# Patient Record
Sex: Male | Born: 1950 | Race: Black or African American | Hispanic: No | Marital: Married | Smoking: Never smoker
Health system: Southern US, Community
[De-identification: ages and names within clinical notes are randomized; demographics above are authoritative.]

## PROBLEM LIST (undated history)

## (undated) DIAGNOSIS — I509 Heart failure, unspecified: Secondary | ICD-10-CM

## (undated) DIAGNOSIS — C801 Malignant (primary) neoplasm, unspecified: Secondary | ICD-10-CM

## (undated) DIAGNOSIS — I10 Essential (primary) hypertension: Secondary | ICD-10-CM

## (undated) DIAGNOSIS — E785 Hyperlipidemia, unspecified: Secondary | ICD-10-CM

## (undated) DIAGNOSIS — K219 Gastro-esophageal reflux disease without esophagitis: Secondary | ICD-10-CM

## (undated) HISTORY — PX: EYE SURGERY: SHX253

## (undated) HISTORY — DX: Hyperlipidemia, unspecified: E78.5

## (undated) HISTORY — DX: Heart failure, unspecified: I50.9

---

## 1998-12-26 DIAGNOSIS — I509 Heart failure, unspecified: Secondary | ICD-10-CM

## 1998-12-26 HISTORY — DX: Heart failure, unspecified: I50.9

## 2002-09-19 ENCOUNTER — Encounter: Payer: Self-pay | Admitting: Emergency Medicine

## 2002-09-19 ENCOUNTER — Observation Stay (HOSPITAL_COMMUNITY): Admission: EM | Admit: 2002-09-19 | Discharge: 2002-09-21 | Payer: Self-pay | Admitting: Emergency Medicine

## 2002-09-20 ENCOUNTER — Encounter: Payer: Self-pay | Admitting: Cardiovascular Disease

## 2002-09-21 ENCOUNTER — Encounter: Payer: Self-pay | Admitting: Cardiovascular Disease

## 2003-01-01 ENCOUNTER — Encounter: Admission: RE | Admit: 2003-01-01 | Discharge: 2003-04-01 | Payer: Self-pay | Admitting: Internal Medicine

## 2003-06-25 ENCOUNTER — Emergency Department (HOSPITAL_COMMUNITY): Admission: EM | Admit: 2003-06-25 | Discharge: 2003-06-25 | Payer: Self-pay | Admitting: Emergency Medicine

## 2004-10-04 ENCOUNTER — Emergency Department (HOSPITAL_COMMUNITY): Admission: EM | Admit: 2004-10-04 | Discharge: 2004-10-05 | Payer: Self-pay | Admitting: Emergency Medicine

## 2004-10-27 ENCOUNTER — Ambulatory Visit: Payer: Self-pay | Admitting: Gastroenterology

## 2004-11-01 ENCOUNTER — Ambulatory Visit: Payer: Self-pay | Admitting: Gastroenterology

## 2006-04-21 ENCOUNTER — Ambulatory Visit: Payer: Self-pay | Admitting: Internal Medicine

## 2006-05-08 ENCOUNTER — Ambulatory Visit: Payer: Self-pay | Admitting: Internal Medicine

## 2006-05-15 ENCOUNTER — Ambulatory Visit: Payer: Self-pay | Admitting: Internal Medicine

## 2006-10-12 ENCOUNTER — Ambulatory Visit: Payer: Self-pay | Admitting: Internal Medicine

## 2007-11-16 DIAGNOSIS — E785 Hyperlipidemia, unspecified: Secondary | ICD-10-CM

## 2007-11-30 ENCOUNTER — Ambulatory Visit: Payer: Self-pay | Admitting: Internal Medicine

## 2007-11-30 LAB — CONVERTED CEMR LAB
AST: 26 units/L (ref 0–37)
Albumin: 4.1 g/dL (ref 3.5–5.2)
Basophils Absolute: 0 10*3/uL (ref 0.0–0.1)
Bilirubin, Direct: 0.1 mg/dL (ref 0.0–0.3)
Chloride: 106 meq/L (ref 96–112)
Creatinine, Ser: 1.3 mg/dL (ref 0.4–1.5)
Creatinine,U: 194.5 mg/dL
Eosinophils Absolute: 0.4 10*3/uL (ref 0.0–0.6)
Eosinophils Relative: 5.4 % — ABNORMAL HIGH (ref 0.0–5.0)
GFR calc non Af Amer: 61 mL/min
Glucose, Bld: 98 mg/dL (ref 70–99)
HCT: 40.6 % (ref 39.0–52.0)
Hemoglobin: 14 g/dL (ref 13.0–17.0)
Lymphocytes Relative: 40.9 % (ref 12.0–46.0)
MCHC: 34.4 g/dL (ref 30.0–36.0)
MCV: 91.9 fL (ref 78.0–100.0)
Microalb, Ur: 1.1 mg/dL (ref 0.0–1.9)
Monocytes Absolute: 0.8 10*3/uL — ABNORMAL HIGH (ref 0.2–0.7)
Neutrophils Relative %: 43.4 % (ref 43.0–77.0)
Potassium: 5.6 meq/L — ABNORMAL HIGH (ref 3.5–5.1)
RBC: 4.42 M/uL (ref 4.22–5.81)
Sodium: 142 meq/L (ref 135–145)
Total Bilirubin: 0.5 mg/dL (ref 0.3–1.2)
WBC: 8.2 10*3/uL (ref 4.5–10.5)

## 2007-12-07 ENCOUNTER — Ambulatory Visit: Payer: Self-pay | Admitting: Internal Medicine

## 2007-12-07 LAB — CONVERTED CEMR LAB
Cholesterol, target level: 200 mg/dL
LDL Goal: 100 mg/dL

## 2008-01-24 ENCOUNTER — Telehealth: Payer: Self-pay | Admitting: Internal Medicine

## 2008-01-25 ENCOUNTER — Ambulatory Visit: Payer: Self-pay | Admitting: Internal Medicine

## 2008-01-25 DIAGNOSIS — F528 Other sexual dysfunction not due to a substance or known physiological condition: Secondary | ICD-10-CM

## 2008-01-25 DIAGNOSIS — H6120 Impacted cerumen, unspecified ear: Secondary | ICD-10-CM

## 2008-03-12 ENCOUNTER — Ambulatory Visit: Payer: Self-pay | Admitting: Internal Medicine

## 2008-03-12 LAB — CONVERTED CEMR LAB
AST: 26 units/L (ref 0–37)
Bilirubin, Direct: 0.1 mg/dL (ref 0.0–0.3)
Cholesterol: 281 mg/dL (ref 0–200)
Direct LDL: 200.7 mg/dL
HDL: 49.7 mg/dL (ref >39.0)
Total Bilirubin: 0.6 mg/dL (ref 0.3–1.2)
Total Protein: 7.2 g/dL (ref 6.0–8.3)
Triglycerides: 134 mg/dL (ref 0–149)

## 2008-03-26 ENCOUNTER — Ambulatory Visit: Payer: Self-pay | Admitting: Internal Medicine

## 2008-04-15 ENCOUNTER — Telehealth: Payer: Self-pay | Admitting: Internal Medicine

## 2008-11-19 ENCOUNTER — Ambulatory Visit: Payer: Self-pay | Admitting: Internal Medicine

## 2008-11-19 DIAGNOSIS — I10 Essential (primary) hypertension: Secondary | ICD-10-CM | POA: Insufficient documentation

## 2008-11-19 LAB — CONVERTED CEMR LAB: Cholesterol: 281 mg/dL (ref 0–200)

## 2009-04-07 ENCOUNTER — Ambulatory Visit: Payer: Self-pay | Admitting: Internal Medicine

## 2009-04-08 LAB — CONVERTED CEMR LAB
CO2: 29 meq/L (ref 19–32)
Calcium: 9.6 mg/dL (ref 8.4–10.5)
Chloride: 108 meq/L (ref 96–112)
Potassium: 3.8 meq/L (ref 3.5–5.1)
Sodium: 143 meq/L (ref 135–145)
Testosterone: 292.89 ng/dL — ABNORMAL LOW (ref 350.00–890.00)

## 2009-04-13 ENCOUNTER — Ambulatory Visit: Payer: Self-pay | Admitting: Internal Medicine

## 2009-06-25 ENCOUNTER — Telehealth: Payer: Self-pay | Admitting: Internal Medicine

## 2009-07-08 ENCOUNTER — Telehealth: Payer: Self-pay | Admitting: Internal Medicine

## 2009-07-08 ENCOUNTER — Ambulatory Visit: Payer: Self-pay | Admitting: Internal Medicine

## 2009-07-14 ENCOUNTER — Ambulatory Visit: Payer: Self-pay | Admitting: Internal Medicine

## 2009-07-17 ENCOUNTER — Ambulatory Visit: Payer: Self-pay | Admitting: Internal Medicine

## 2009-07-17 DIAGNOSIS — R42 Dizziness and giddiness: Secondary | ICD-10-CM

## 2009-07-17 LAB — CONVERTED CEMR LAB: BUN: 13 mg/dL (ref 6–23)

## 2009-07-21 ENCOUNTER — Ambulatory Visit: Payer: Self-pay | Admitting: Internal Medicine

## 2009-10-08 ENCOUNTER — Encounter (INDEPENDENT_AMBULATORY_CARE_PROVIDER_SITE_OTHER): Payer: Self-pay | Admitting: *Deleted

## 2009-10-26 ENCOUNTER — Ambulatory Visit: Payer: Self-pay | Admitting: Internal Medicine

## 2010-01-22 ENCOUNTER — Ambulatory Visit: Payer: Self-pay | Admitting: Internal Medicine

## 2010-01-22 DIAGNOSIS — N410 Acute prostatitis: Secondary | ICD-10-CM | POA: Insufficient documentation

## 2010-04-12 ENCOUNTER — Telehealth: Payer: Self-pay | Admitting: Internal Medicine

## 2010-06-14 ENCOUNTER — Telehealth: Payer: Self-pay | Admitting: Internal Medicine

## 2010-06-14 ENCOUNTER — Ambulatory Visit: Payer: Self-pay | Admitting: Internal Medicine

## 2010-06-14 DIAGNOSIS — IMO0002 Reserved for concepts with insufficient information to code with codable children: Secondary | ICD-10-CM

## 2010-09-09 ENCOUNTER — Ambulatory Visit: Payer: Self-pay | Admitting: Internal Medicine

## 2010-09-09 DIAGNOSIS — J069 Acute upper respiratory infection, unspecified: Secondary | ICD-10-CM | POA: Insufficient documentation

## 2010-09-09 DIAGNOSIS — J029 Acute pharyngitis, unspecified: Secondary | ICD-10-CM | POA: Insufficient documentation

## 2010-09-09 LAB — CONVERTED CEMR LAB: Rapid Strep: NEGATIVE

## 2010-12-02 ENCOUNTER — Ambulatory Visit: Payer: Self-pay

## 2010-12-02 ENCOUNTER — Ambulatory Visit: Payer: Self-pay | Admitting: Internal Medicine

## 2011-01-25 NOTE — Progress Notes (Signed)
Summary: viagra rx  Phone Note Call from Patient Call back at Home Phone [phone]   Caller: Patient Call For: Stacie Glaze MD Summary of Call: Brian Kaufman pt needs viagra Initial call taken by: Heron Sabins,  April 12, 2010 10:30 AM    Prescriptions: VIAGRA 100 MG TABS (SILDENAFIL CITRATE) Use as directed  #6 x 6   Entered by:   Willy Eddy, LPN   Authorized by:   Stacie Glaze MD   Signed by:   Willy Eddy, LPN on 64/33/2951   Method used:   Telephoned to ...       [facility] [address] (retail)       [address] [place], [place]  [address]       Ph: [phone] or [phone]       Fax: [phone]   RxID:   [id number]

## 2011-01-25 NOTE — Assessment & Plan Note (Signed)
Summary: possible prostatitis'/bmw   Vital Signs:  Patient profile:   60 year old male Height:      66 inches Weight:      188 pounds BMI:     30.45 Temp:     98.4 degrees F oral Pulse rate:   80 / minute Resp:     14 per minute BP sitting:   136 / 80  (left arm)  Vitals Entered By: Willy Eddy, LPN (January 22, 2010 4:50 PM) CC: c/o groin pain radiating around to rt flank, Dysuria   CC:  c/o groin pain radiating around to rt flank and Dysuria.  History of Present Illness: acute visit for pain in testicles nad grion associated with moderate dysuria and bacl pain of several days duration  Dysuria      This is a 60 year old man who presents with Dysuria.  The patient reports burning with urination, urinary frequency, and urgency.  Associated symptoms include back pain.  The patient denies the following associated symptoms: shaking chills and flank pain.  The patient denies the following risk factors: diabetes, prior antibiotics, immunosuppression, history of GU anomaly, history of pyelonephritis, pregnancy, history of STD, and analgesic abuse.    Preventive Screening-Counseling & Management  Alcohol-Tobacco     Smoking Status: never     Passive Smoke Exposure: no  Problems Prior to Update: 1)  Acute Prostatitis  (ICD-601.0) 2)  Vertigo  (ICD-780.4) 3)  Hypertension, Labile  (ICD-401.9) 4)  Erectile Dysfunction, Mild  (ICD-302.72) 5)  Cerumen Impaction, Bilateral  (ICD-380.4) 6)  Preventive Health Care  (ICD-V70.0) 7)  Family History Diabetes 1st Degree Relative  (ICD-V18.0) 8)  Hyperlipidemia  (ICD-272.4)  Current Problems (verified): 1)  Vertigo  (ICD-780.4) 2)  Hypertension, Labile  (ICD-401.9) 3)  Erectile Dysfunction, Mild  (ICD-302.72) 4)  Cerumen Impaction, Bilateral  (ICD-380.4) 5)  Preventive Health Care  (ICD-V70.0) 6)  Family History Diabetes 1st Degree Relative  (ICD-V18.0) 7)  Hyperlipidemia  (ICD-272.4)  Medications Prior to Update: 1)  Benicar 20  Mg Tabs (Olmesartan Medoxomil) .... One By Mouth Daily 2)  Crestor 10 Mg Tabs (Rosuvastatin Calcium) .... One By Mouth Daily 3)  Viagra 100 Mg Tabs (Sildenafil Citrate) .... Use As Directed 4)  Antivert 25 Mg Tabs (Meclizine Hcl) .... One By Mouth Tid 5)  Androgel Pump 1 % Gel (Testosterone) .... 4 Pumps To Skin Daily  Current Medications (verified): 1)  Benicar 20 Mg Tabs (Olmesartan Medoxomil) .... One By Mouth Daily 2)  Crestor 10 Mg Tabs (Rosuvastatin Calcium) .... One By Mouth Daily 3)  Viagra 100 Mg Tabs (Sildenafil Citrate) .... Use As Directed 4)  Androgel Pump 1 % Gel (Testosterone) .... 4 Pumps To Skin Daily 5)  Ciprofloxacin Hcl 500 Mg Tabs (Ciprofloxacin Hcl) .... One By Mouth Two Times A Day  Allergies (verified): No Known Drug Allergies  Past History:  Family History: Last updated: 2007-12-13 father died from brain tumor mother died fro DM HTN Family History Diabetes 1st degree relative  Social History: Last updated: 2007-12-13 Retired Married Never Smoked Alcohol use-no Drug use-no Regular exercise-yes  Risk Factors: Exercise: yes (2007/12/13)  Risk Factors: Smoking Status: never (01/22/2010) Passive Smoke Exposure: no (01/22/2010)  Past medical, surgical, family and social histories (including risk factors) reviewed, and no changes noted (except as noted below).  Past Medical History: Reviewed history from 11/16/2007 and no changes required. Hyperlipidemia Congestive heart failure Diabetes mellitus, type II  Family History: Reviewed history from 12/13/2007 and no changes  required. father died from brain tumor mother died fro DM HTN Family History Diabetes 1st degree relative  Social History: Reviewed history from 12/07/2007 and no changes required. Retired Married Never Smoked Alcohol use-no Drug use-no Regular exercise-yes  Review of Systems  The patient denies anorexia, fever, weight loss, weight gain, vision loss, decreased hearing,  hoarseness, chest pain, syncope, dyspnea on exertion, peripheral edema, prolonged cough, headaches, hemoptysis, abdominal pain, hematochezia, hematuria, incontinence, genital sores, muscle weakness, suspicious skin lesions, transient blindness, difficulty walking, depression, unusual weight change, abnormal bleeding, enlarged lymph nodes, angioedema, breast masses, and testicular masses.    Physical Exam  General:  Well-developed,well-nourished,in no acute distress; alert,appropriate and cooperative throughout examination Head:  atraumatic and diffuse alopecia.   Eyes:  pupils equal and pupils round.   Ears:  R ear normal and L ear normal.   Neck:  No deformities, masses, or tenderness noted. Lungs:  normal respiratory effort and R base dullness.   Heart:  normal rate and regular rhythm.   Rectal:  no external abnormalities, no hemorrhoids, and normal sphincter tone.   Prostate:  tender, boggy, and 2+ enlarged.   Msk:  no joint tenderness and no joint swelling.   Pulses:  R and L carotid,radial,femoral,dorsalis pedis and posterior tibial pulses are full and equal bilaterally Extremities:  No clubbing, cyanosis, edema, or deformity noted with normal full range of motion of all joints.     Impression & Recommendations:  Problem # 1:  ACUTE PROSTATITIS (ICD-601.0) Assessment New acute protatitis no evidence of testiculat masses prostate is boggy and tender and has a hx of prior prostate infectiions ( many years ago) antibiotic sent to pharmacy and a carefull discussio of symptoms and when to return should they not improve  Problem # 2:  HYPERTENSION, LABILE (ICD-401.9) Assessment: Improved  His updated medication list for this problem includes:    Benicar 20 Mg Tabs (Olmesartan medoxomil) ..... One by mouth daily  BP today: 136/80 Prior BP: 142/80 (10/26/2009)  Prior 10 Yr Risk Heart Disease: Not enough information (12/07/2007)  Labs Reviewed: K+: 3.8 (04/07/2009) Creat: : 1.3  (07/17/2009)   Chol: 281 (11/19/2008)   HDL: 64.6 (11/19/2008)   LDL: DEL (11/19/2008)   TG: 97 (11/19/2008)  Complete Medication List: 1)  Benicar 20 Mg Tabs (Olmesartan medoxomil) .... One by mouth daily 2)  Crestor 10 Mg Tabs (Rosuvastatin calcium) .... One by mouth daily 3)  Viagra 100 Mg Tabs (Sildenafil citrate) .... Use as directed 4)  Androgel Pump 1 % Gel (Testosterone) .... 4 pumps to skin daily 5)  Ciprofloxacin Hcl 500 Mg Tabs (Ciprofloxacin hcl) .... One by mouth two times a day  Patient Instructions: 1)  Take your antibiotic as prescribed until ALL of it is gone, but stop if you develop a rash or swelling and contact our office as soon as possible. Prescriptions: CIPROFLOXACIN HCL 500 MG TABS (CIPROFLOXACIN HCL) one by mouth two times a day  #42 x 0   Entered and Authorized by:   Stacie Glaze MD   Signed by:   Stacie Glaze MD on 01/22/2010   Method used:   Electronically to        [facility] [address] (retail)       [address] [place], [place]  [address]       Ph: [phone] or [phone]       Fax: [phone]   RxID:   [id number] CIPROFLOXACIN HCL  500 MG TABS (CIPROFLOXACIN HCL) one by mouth two times a day  #42 x 0   Entered and Authorized by:   Stacie Glaze MD   Signed by:   Stacie Glaze MD on 01/22/2010   Method used:   [facility]ically to        [facility]  [id number] (retail)       [address]       [place], [place]  [address]       Ph: [phone] or [phone]       Fax: [phone]   RxID:   [id number]

## 2011-01-25 NOTE — Progress Notes (Signed)
Summary: multiple problems  Phone Note Call from Patient   Caller: Patient Call For: Stacie Glaze MD Reason for Call: Acute Illness Summary of Call: Pt is complaining of low back pain, and post coital pain about 45 min later.  BP was 170/120 yesterday, but went down after taking BP.  States he takes his meds "most of the time?Marland Kitchen Also complains of vertigo? [phone] Initial call taken by: Lynann Beaver CMA,  June 14, 2010 8:44 AM  Follow-up for Phone Call        ov given for this afternnon Follow-up by: Willy Eddy, LPN,  June 14, 2010 9:25 AM

## 2011-01-25 NOTE — Assessment & Plan Note (Signed)
Summary: flu like symptoms//slm   Vital Signs:  Patient profile:   60 year old male Height:      66 inches Weight:      190 pounds O2 Sat:      99 % on Room air Temp:     97.7 degrees F oral Pulse rate:   73 / minute BP sitting:   138 / 78  (left arm)  Vitals Entered By: Jeremy Johann CMA (December 02, 2010 11:27 AM)  O2 Flow:  Room air CC: headache, nasal congestion,  dry cough x2weeks, issue with prostate   CC:  headache, nasal congestion, dry cough x2weeks, and issue with prostate.  History of Present Illness: Patient presents to clinic as a workin for evaluation of URI symptoms. Notes 7+ day h/o productive cough, nasal congestion and drainagae. Denies f/c, dyspnea, chest pain or wheezing. H/o possible prostatitis s/p course of cipro with resolution of symptoms. Believes has minimal return of symptoms with mild back pain similar to previous. No current urinary symptoms.  Current Medications (verified): 1)  Benicar 20 Mg Tabs (Olmesartan Medoxomil) .... One By Mouth Daily 2)  Crestor 10 Mg Tabs (Rosuvastatin Calcium) .... One By Mouth Daily 3)  Viagra 100 Mg Tabs (Sildenafil Citrate) .... Use As Directed 4)  Meclizine Hcl 25 Mg Tabs (Meclizine Hcl) .... One By Mouth Three Times A Day  Allergies (verified): No Known Drug Allergies  Review of Systems      See HPI  Physical Exam  General:  Well-developed,well-nourished,in no acute distress; alert,appropriate and cooperative throughout examination Head:  Normocephalic and atraumatic without obvious abnormalities. No apparent alopecia or balding. Ears:  External ear exam shows no significant lesions or deformities.  Otoscopic examination reveals clear canals, tympanic membranes are intact bilaterally without bulging, retraction, inflammation or discharge. Hearing is grossly normal bilaterally. Nose:  no external deformity, no airflow obstruction, no intranasal foreign body, no mucosal friability, mucosal erythema, and mucosal  edema.   Mouth:  pharynx pink and moist, no erythema, and no exudates.   Neck:  No deformities, masses, or tenderness noted. Lungs:  Normal respiratory effort, chest expands symmetrically. Lungs are clear to auscultation, no crackles or wheezes. Heart:  Normal rate and regular rhythm. S1 and S2 normal without gallop, murmur, click, rub or other extra sounds. Skin:  turgor normal and no rashes.     Impression & Recommendations:  Problem # 1:  URI (ICD-465.9) Begin levaquin as prescribed. Continue otc symptomatic tx. Increase by mouth fluids. Followup closely if no improvement or worsening.  Problem # 2:  ACUTE PROSTATITIS (ICD-601.0) Possible return of minimal symptoms. Abx as above and followup if no resolution.  Complete Medication List: 1)  Benicar 20 Mg Tabs (Olmesartan medoxomil) .... One by mouth daily 2)  Crestor 10 Mg Tabs (Rosuvastatin calcium) .... One by mouth daily 3)  Viagra 100 Mg Tabs (Sildenafil citrate) .... Use as directed 4)  Meclizine Hcl 25 Mg Tabs (Meclizine hcl) .... One by mouth three times a day 5)  Levaquin 500 Mg Tabs (Levofloxacin) .... One by mouth once daily  Patient Instructions: 1)  Get plenty of rest, drink lots of clear liquids, and use Tylenol or Ibuprofen for fever and comfort. Return in 7-10 days if you're not better:sooner if you're feeling worse. 2)  Take your antibiotic as prescribed until ALL of it is gone, but stop if you develop a rash or swelling and contact our office as soon as possible. Prescriptions: LEVAQUIN 500 MG TABS (LEVOFLOXACIN)  one by mouth once daily  #7 x 0   Entered and Authorized by:   Edwyna Perfect MD   Signed by:   Edwyna Perfect MD on 12/02/2010   Method used:   Electronically to        [facility] [phone]* (retail)       [address] [place], [place]  [address]       Ph: [phone] or [phone]       Fax: [phone]   RxID:   [id number]    Orders Added: 1)  Est. Patient Level IV [34742]

## 2011-01-25 NOTE — Assessment & Plan Note (Signed)
Summary: elevated bp/bmw   Vital Signs:  Patient profile:   60 year old male Height:      66 inches Weight:      188 pounds BMI:     30.45 Temp:     98.4 degrees F oral Pulse rate:   76 / minute Resp:     14 per minute BP sitting:   146 / 80  (left arm)  Vitals Entered By: Willy Eddy, LPN (June 14, 2010 4:20 PM) CC: c/o groin and low back pain and elevated bp-m has not been taking benicar as ordered   CC:  c/o groin and low back pain and elevated bp-m has not been taking benicar as ordered.  Preventive Screening-Counseling & Management  Alcohol-Tobacco     Smoking Status: never     Passive Smoke Exposure: no  Current Problems (verified): 1)  Acute Prostatitis  (ICD-601.0) 2)  Vertigo  (ICD-780.4) 3)  Hypertension, Labile  (ICD-401.9) 4)  Erectile Dysfunction, Mild  (ICD-302.72) 5)  Cerumen Impaction, Bilateral  (ICD-380.4) 6)  Preventive Health Care  (ICD-V70.0) 7)  Family History Diabetes 1st Degree Relative  (ICD-V18.0) 8)  Hyperlipidemia  (ICD-272.4)  Current Medications (verified): 1)  Benicar 20 Mg Tabs (Olmesartan Medoxomil) .... One By Mouth Daily 2)  Crestor 10 Mg Tabs (Rosuvastatin Calcium) .... One By Mouth Daily 3)  Viagra 100 Mg Tabs (Sildenafil Citrate) .... Use As Directed 4)  Androgel Pump 1 % Gel (Testosterone) .... 4 Pumps To Skin Daily  Allergies (verified): No Known Drug Allergies  Past History:  Family History: Last updated: December 23, 2007 father died from brain tumor mother died fro DM HTN Family History Diabetes 1st degree relative  Social History: Last updated: 12/23/07 Retired Married Never Smoked Alcohol use-no Drug use-no Regular exercise-yes  Risk Factors: Exercise: yes (12/23/2007)  Risk Factors: Smoking Status: never (06/14/2010) Passive Smoke Exposure: no (06/14/2010)  Past medical, surgical, family and social histories (including risk factors) reviewed, and no changes noted (except as noted below).  Past  Medical History: Reviewed history from 11/16/2007 and no changes required. Hyperlipidemia Congestive heart failure Diabetes mellitus, type II  Family History: Reviewed history from 2007-12-23 and no changes required. father died from brain tumor mother died fro DM HTN Family History Diabetes 1st degree relative  Social History: Reviewed history from 23-Dec-2007 and no changes required. Retired Married Never Smoked Alcohol use-no Drug use-no Regular exercise-yes  Review of Systems  The patient denies anorexia, fever, weight loss, weight gain, vision loss, decreased hearing, hoarseness, chest pain, syncope, dyspnea on exertion, peripheral edema, prolonged cough, headaches, hemoptysis, abdominal pain, melena, hematochezia, severe indigestion/heartburn, hematuria, incontinence, genital sores, muscle weakness, suspicious skin lesions, transient blindness, difficulty walking, depression, unusual weight change, abnormal bleeding, enlarged lymph nodes, angioedema, and breast masses.    Physical Exam  General:  Well-developed,well-nourished,in no acute distress; alert,appropriate and cooperative throughout examination Head:  atraumatic and diffuse alopecia.   Eyes:  pupils equal and pupils round.   Ears:  R ear normal and L ear normal.   Nose:  no external deformity and no nasal discharge.   Mouth:  Oral mucosa and oropharynx without lesions or exudates.  Teeth in good repair. Neck:  No deformities, masses, or tenderness noted. Lungs:  normal respiratory effort and R base dullness.   Heart:  normal rate and regular rhythm.   Abdomen:  Bowel sounds positive,abdomen soft and non-tender without masses, organomegaly or hernias noted. Msk:  no joint tenderness and no joint swelling.   Extremities:  No clubbing, cyanosis, edema, or deformity noted with normal full range of motion of all joints.   Neurologic:  alert & oriented X3 and cranial nerves II-XII intact.     Impression &  Recommendations:  Problem # 1:  HYPERTENSION, LABILE (ICD-401.9)  His updated medication list for this problem includes:    Benicar 20 Mg Tabs (Olmesartan medoxomil) ..... One by mouth daily  BP today: 146/80 Prior BP: 136/80 (01/22/2010)  Prior 10 Yr Risk Heart Disease: Not enough information (12/07/2007)  Labs Reviewed: K+: 3.8 (04/07/2009) Creat: : 1.3 (07/17/2009)   Chol: 281 (11/19/2008)   HDL: 64.6 (11/19/2008)   LDL: DEL (11/19/2008)   TG: 97 (11/19/2008)  Problem # 2:  ACUTE PROSTATITIS (ICD-601.0) recurrent cipro for 3 weeks urge to take all or recurrence is likely ( he has a habit of stopping as soon as the symptoms stop  Complete Medication List: 1)  Benicar 20 Mg Tabs (Olmesartan medoxomil) .... One by mouth daily 2)  Crestor 10 Mg Tabs (Rosuvastatin calcium) .... One by mouth daily 3)  Viagra 100 Mg Tabs (Sildenafil citrate) .... Use as directed 4)  Androgel Pump 1 % Gel (Testosterone) .... 4 pumps to skin daily 5)  Ciprofloxacin Hcl 500 Mg Tabs (Ciprofloxacin hcl) .... One by mouth two times a day for 21 days 6)  Meclizine Hcl 25 Mg Tabs (Meclizine hcl) .... One by mouth three times a day  Patient Instructions: 1)  Please schedule a follow-up appointment in 3 months. Prescriptions: MECLIZINE HCL 25 MG TABS (MECLIZINE HCL) one by mouth three times a day  #30 x 11   Entered and Authorized by:   Stacie Glaze MD   Signed by:   Stacie Glaze MD on 06/14/2010   Method used:   Electronically to        [facility] [phone]* (retail)       [address] [place], [place]  [address]       Ph: [phone] or [phone]       Fax: [phone]   RxID:   [id number] CIPROFLOXACIN HCL 500 MG TABS (CIPROFLOXACIN HCL) one by mouth two times a day for 21 days  #42 x 0   Entered and Authorized by:   Stacie Glaze MD   Signed by:   Stacie Glaze MD on 06/14/2010   Method used:   Electronically to        [facility] [phone]* (retail)       [address] [place], [place]  [address]       Ph: [phone] or [phone]       Fax: [phone]   RxID:   [id number] CIPRO 500 MG TABS (CIPROFLOXACIN HCL) one by mouth two times a day  #42 x 0   Entered and Authorized by:   Stacie Glaze MD   Signed by:   Stacie Glaze MD on 06/14/2010   Method used:   Electronically to        [facility]  [id number] (retail)       [address]       [place], [place]  [address]       Ph: [phone] or [phone]       Fax: [phone]   RxID:   [id number]

## 2011-01-25 NOTE — Assessment & Plan Note (Signed)
Summary: st/njr   Vital Signs:  Patient profile:   60 year old male Weight:      183 pounds Temp:     99.2 degrees F oral BP sitting:   140 / 80  (right arm) Cuff size:   regular  Vitals Entered By: Duard Brady LPN (September 09, 2010 2:15 PM) CC: c/o fatigue , tight cough , exposed to strep Is Patient Diabetic? No   CC:  c/o fatigue , tight cough , and exposed to strep.  History of Present Illness: 60 year old patient with a two to 3 day history of sore throat and chest congestion and cough.  There is been some concern about a strep exposure is noncompliant.  His cough and fatigue.  Does have low-grade fever.  No sputum production, shortness of breath or chest pain  Allergies (verified): No Known Drug Allergies  Past History:  Past Medical History: Reviewed history from 11/16/2007 and no changes required. Hyperlipidemia Congestive heart failure Diabetes mellitus, type II  Review of Systems       The patient complains of anorexia, fever, and prolonged cough.  The patient denies weight loss, weight gain, vision loss, decreased hearing, hoarseness, chest pain, syncope, dyspnea on exertion, peripheral edema, headaches, hemoptysis, abdominal pain, melena, hematochezia, severe indigestion/heartburn, hematuria, incontinence, genital sores, muscle weakness, suspicious skin lesions, transient blindness, difficulty walking, depression, unusual weight change, abnormal bleeding, enlarged lymph nodes, angioedema, breast masses, and testicular masses.    Physical Exam  General:  Well-developed,well-nourished,in no acute distress; alert,appropriate and cooperative throughout examination Head:  Normocephalic and atraumatic without obvious abnormalities. No apparent alopecia or balding. Eyes:  No corneal or conjunctival inflammation noted. EOMI. Perrla. Funduscopic exam benign, without hemorrhages, exudates or papilledema. Vision grossly normal. Ears:  External ear exam shows no  significant lesions or deformities.  Otoscopic examination reveals clear canals, tympanic membranes are intact bilaterally without bulging, retraction, inflammation or discharge. Hearing is grossly normal bilaterally. Mouth:  Oral mucosa and oropharynx without lesions or exudates.  Teeth in good repair. Neck:  No deformities, masses, or tenderness noted. Lungs:  Normal respiratory effort, chest expands symmetrically. Lungs are clear to auscultation, no crackles or wheezes. Heart:  Normal rate and regular rhythm. S1 and S2 normal without gallop, murmur, click, rub or other extra sounds.   Impression & Recommendations:  Problem # 1:  URI (ICD-465.9)  His updated medication list for this problem includes:    Hydrocodone-homatropine 5-1.5 Mg/50ml Syrp (Hydrocodone-homatropine) .Marland Kitchen... 1 teaspoon every 6 hours as needed for cough  Problem # 2:  SORE THROAT (ICD-462)  Orders: Rapid Strep ([address])  Complete Medication List: 1)  Benicar 20 Mg Tabs (Olmesartan medoxomil) .... One by mouth daily 2)  Crestor 10 Mg Tabs (Rosuvastatin calcium) .... One by mouth daily 3)  Viagra 100 Mg Tabs (Sildenafil citrate) .... Use as directed 4)  Androgel Pump 1 % Gel (Testosterone) .... 4 pumps to skin daily 5)  Meclizine Hcl 25 Mg Tabs (Meclizine hcl) .... One by mouth three times a day 6)  Hydrocodone-homatropine 5-1.5 Mg/36ml Syrp (Hydrocodone-homatropine) .Marland Kitchen.. 1 teaspoon every 6 hours as needed for cough  Patient Instructions: 1)  Limit your Sodium (Salt) to less than 2 grams a day(slightly less than 1/2 a teaspoon) to prevent fluid retention, swelling, or worsening of symptoms. 2)  Get plenty of rest, drink lots of clear liquids, and use Tylenol for fever and comfort. Return in 7-10 days if you're not better:sooner if you're feeling worse. Prescriptions: HYDROCODONE-HOMATROPINE 5-1.5 MG/5ML SYRP (HYDROCODONE-HOMATROPINE)  1 teaspoon every 6 hours as needed for cough  #6 oz x 0   Entered and Authorized by:    Gordy Savers  MD   Signed by:   Gordy Savers  MD on 09/09/2010   Method used:   Print then Give to Patient   RxID:   [id number]   Laboratory Results  Date/Time Received: September 09, 2010 2:31 PM  Date/Time Reported: September 09, 2010 2:31 PM   Other Tests  Rapid Strep: negative

## 2011-02-10 ENCOUNTER — Other Ambulatory Visit: Payer: Self-pay | Admitting: Internal Medicine

## 2011-02-11 ENCOUNTER — Other Ambulatory Visit: Payer: Self-pay | Admitting: *Deleted

## 2011-02-11 MED ORDER — OLMESARTAN MEDOXOMIL 20 MG PO TABS: 20.0000 mg | ORAL_TABLET | Freq: Every day | ORAL | Status: DC

## 2011-02-11 MED ORDER — ROSUVASTATIN CALCIUM 10 MG PO TABS: 10.0000 mg | ORAL_TABLET | Freq: Every day | ORAL | Status: DC

## 2011-05-13 NOTE — H&P (Signed)
Brian Kaufman, Brian Kaufman NO.:  [id number]   MEDICAL RECORD NO.:  [id number]                   PATIENT TYPE:  INP   LOCATION:  0374                                 FACILITY:  [facility]   PHYSICIAN:  Brian Kaufman, M.D. [facility]           DATE OF BIRTH:  01-Jan-1951   DATE OF ADMISSION:  09/19/2002  DATE OF DISCHARGE:                                HISTORY & PHYSICAL   Consult by Dr. Everardo All for chest pain, heart failure.   The patient is a very extremely pleasant 60 year old patient of Dr. Lovell Sheehan.  He has a long-standing history of GERD which he takes H2 blockers for.  He  had a severe episode of substernal chest pain after eating pizza.  This was  like heartburn, but much worse and did not go away.  He has no previous  history of coronary artery disease.   The patient had some associated diaphoresis.  Interestingly, he had no  shortness of breath but his chest x-ray showed a question of mild congestive  heart failure.  He also had mild cardiomegaly on his chest x-ray.   His pain was relieved in the ER with morphine and he has not had a  recurrence.  Again, he has never had any significant cardiac problems.  There is a question of borderline hypertension and positive family history  of coronary disease.  He also has a significant history of sarcoid in the  family.   Since his hospitalization he has had a normal 2-D echocardiogram with no  wall motion abnormalities.  Ruled out for myocardial infarction and his EKG  is normal.   PHYSICAL EXAMINATION:  VITAL SIGNS:  Blood pressure 130/60, pulse 68 and  regular.  LUNGS:  Clear.  NECK:  Carotids normal.  CARDIAC:  There is an S1, S2 without murmur, rub, gallop, click.  ABDOMEN:  Benign.  EXTREMITIES:  Lower extremities with intact pulses, no edema.   LABORATORIES:  As indicated, his electrocardiogram showed sinus rhythm with  borderline voltage criteria for LVH.  There are no acute changes.  His  enzymes  are negative.  His chest x-ray showed mild cardiomegaly with mild  pulmonary venous congestion.   He was given one dose of Lasix in the ER and his lungs are clear.   IMPRESSION:  The patient's episode of chest pain sounded like a severe  episode of reflux or gastroesophageal reflux disease after eating pizza.  However, the slight heart failure on chest x-ray and cardiomegaly bother me  a little bit.  His echocardiogram was not remarkable and indeed, did not  really show significant cardiac enlargement with normal LV function.  Since  his EKG and enzymes are normal, I think it is reasonable to proceed with a  stress Cardiolite study in the morning.  ACE inhibitor will be added for  blood pressure control.  Further recommendations will be based on the  results of  his stress test.  The plan was done over with his wife and family  and they are willing to proceed.                                              Brian Kaufman, M.D. [facility]   PCN/MEDQ  D:  09/20/2002  T:  09/20/2002  Job:  [id number]

## 2011-07-18 ENCOUNTER — Other Ambulatory Visit: Payer: Self-pay | Admitting: *Deleted

## 2011-07-18 MED ORDER — ROSUVASTATIN CALCIUM 10 MG PO TABS: 10.0000 mg | ORAL_TABLET | Freq: Every day | ORAL | Status: DC

## 2011-07-18 MED ORDER — OLMESARTAN MEDOXOMIL 20 MG PO TABS: 20.0000 mg | ORAL_TABLET | Freq: Every day | ORAL | Status: DC

## 2011-08-01 ENCOUNTER — Encounter: Payer: Self-pay | Admitting: Internal Medicine

## 2011-08-02 ENCOUNTER — Ambulatory Visit (INDEPENDENT_AMBULATORY_CARE_PROVIDER_SITE_OTHER): Payer: Self-pay | Admitting: Internal Medicine

## 2011-08-02 ENCOUNTER — Encounter: Payer: Self-pay | Admitting: Internal Medicine

## 2011-08-02 VITALS — BP 140/80 | HR 72 | Temp 98.2°F | Resp 16 | Ht 67.0 in | Wt 185.0 lb

## 2011-08-02 DIAGNOSIS — N410 Acute prostatitis: Secondary | ICD-10-CM

## 2011-08-02 MED ORDER — TRAMADOL HCL 50 MG PO TABS: 50.0000 mg | ORAL_TABLET | Freq: Four times a day (QID) | ORAL | Status: AC | PRN

## 2011-08-02 MED ORDER — LEVOFLOXACIN 500 MG PO TABS: 500.0000 mg | ORAL_TABLET | Freq: Every day | ORAL | Status: AC

## 2011-08-02 NOTE — Progress Notes (Signed)
  Subjective:    Patient ID: Brian Kaufman, male    DOB: November 09, 1951, 60 y.o.   MRN: [id number]  HPI Patient has a history of recurrent prostatitis he has noticed some testicular pain and back pain as well as noticed some blood in in his urine. No fever or chills but he has a sense of lack of well-being decreased energy   Review of Systems  Genitourinary: Positive for hematuria and testicular pain.  Musculoskeletal: Positive for back pain.   Past Medical History  Diagnosis Date  . Hyperlipidemia   . Diabetes mellitus   . CHF (congestive heart failure)    No past surgical history on file.  reports that he has never smoked. He does not have any smokeless tobacco history on file. He reports that he does not drink alcohol or use illicit drugs. family history includes Diabetes in his mother and Hypertension in his mother. Allergies not on file     Objective:   Physical Exam  Nursing note and vitals reviewed. Constitutional: He appears well-developed and well-nourished.  HENT:  Head: Normocephalic and atraumatic.  Eyes: Conjunctivae are normal. Pupils are equal, round, and reactive to light.  Neck: Normal range of motion. Neck supple.  Cardiovascular: Normal rate and regular rhythm.   Pulmonary/Chest: Effort normal and breath sounds normal.  Abdominal: Soft. Bowel sounds are normal.  Genitourinary: Penile tenderness present.       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

## 2011-09-07 ENCOUNTER — Telehealth: Payer: Self-pay | Admitting: Internal Medicine

## 2011-09-07 NOTE — Telephone Encounter (Signed)
Pt has changed to mail order pharmacy [facility] and is req refills of olmesartan (BENICAR) 20 MG tablet ,rosuvastatin (CRESTOR) 10 MG tablet,VIAGRA 100 MG tablet,levofloxacin (LEVAQUIN) 500 MG tablet. Pls call in to [facility] [phone]. Pt is out of Benicar, Crestor, and Levofloxacin, and is req a partial refill of these [facility] [place] [facility] order arrives.

## 2011-09-07 NOTE — Telephone Encounter (Signed)
Left message on machine levaquin is not on regular med regimen therefore it cant be reordered- the benicar and crestor [facility] [place]  #90w/3 refills in July and is still valid-- if he still wants he sent to [facility] , let us know

## 2011-09-28 ENCOUNTER — Other Ambulatory Visit: Payer: Self-pay

## 2011-10-05 ENCOUNTER — Encounter: Payer: Self-pay | Admitting: Internal Medicine

## 2011-10-05 ENCOUNTER — Ambulatory Visit (INDEPENDENT_AMBULATORY_CARE_PROVIDER_SITE_OTHER): Payer: Self-pay | Admitting: Internal Medicine

## 2011-10-05 VITALS — BP 140/78 | HR 72 | Temp 98.2°F | Resp 16 | Ht 67.0 in | Wt 192.0 lb

## 2011-10-05 DIAGNOSIS — Z0000 Encounter for general adult medical examination without abnormal findings: Secondary | ICD-10-CM

## 2011-10-05 DIAGNOSIS — I10 Essential (primary) hypertension: Secondary | ICD-10-CM

## 2011-10-05 NOTE — Patient Instructions (Signed)
Set up a lab visit as you Mr. fasting laboratory physical

## 2011-10-05 NOTE — Progress Notes (Signed)
  Subjective:    Patient ID: Brian Kaufman, male    DOB: 06-02-1951, 60 y.o.   MRN: [id number]  HPI [facility]    Review of Systems  Constitutional: Negative for fever and fatigue.  HENT: Negative for hearing loss, congestion, neck pain and postnasal drip.   Eyes: Negative for discharge, redness and visual disturbance.  Respiratory: Negative for cough, shortness of breath and wheezing.   Cardiovascular: Negative for leg swelling.  Gastrointestinal: Negative for abdominal pain, constipation and abdominal distention.  Genitourinary: Negative for urgency and frequency.  Musculoskeletal: Negative for joint swelling and arthralgias.  Skin: Negative for color change and rash.  Neurological: Negative for weakness and light-headedness.  Hematological: Negative for adenopathy.  Psychiatric/Behavioral: Negative for behavioral problems.   Past Medical History  Diagnosis Date  . Hyperlipidemia   . Diabetes mellitus   . CHF (congestive heart failure)    No past surgical history on file.  reports that he has never smoked. He does not have any smokeless tobacco history on file. He reports that he does not drink alcohol or use illicit drugs. family history includes Diabetes in his mother and Hypertension in his mother. No Known Allergies     Objective:   Physical Exam  Nursing note and vitals reviewed. Constitutional: He appears well-developed and well-nourished.  HENT:  Head: Normocephalic and atraumatic.  Eyes: Conjunctivae are normal. Pupils are equal, round, and reactive to light.  Neck: Normal range of motion. Neck supple.  Cardiovascular: Normal rate and regular rhythm.   Pulmonary/Chest: Effort normal and breath sounds normal.  Abdominal: Soft. Bowel sounds are normal.  Genitourinary: Rectum normal and prostate normal.  Musculoskeletal: Normal range of motion.  Skin: Skin is warm and dry.  Psychiatric: He has a normal mood and affect. His behavior is normal.            Assessment & Plan:   Informed consent was obtained and side gel was inserted into the ears bilaterally using the lavage kit the ears were lavaged until clean inspection with a cerumen spoon to make sure residual wax was not present patient tolerated the procedure well.

## 2011-10-07 ENCOUNTER — Other Ambulatory Visit (INDEPENDENT_AMBULATORY_CARE_PROVIDER_SITE_OTHER): Payer: Self-pay

## 2011-10-07 DIAGNOSIS — Z0000 Encounter for general adult medical examination without abnormal findings: Secondary | ICD-10-CM

## 2011-10-07 LAB — CBC WITH DIFFERENTIAL/PLATELET
Basophils Absolute: 0 10*3/uL (ref 0.0–0.1)
Eosinophils Absolute: 0.5 10*3/uL (ref 0.0–0.7)
HCT: 38.4 % — ABNORMAL LOW (ref 39.0–52.0)
Lymphs Abs: 2.5 10*3/uL (ref 0.7–4.0)
MCHC: 32 g/dL (ref 30.0–36.0)
MCV: 94.2 fl (ref 78.0–100.0)
Monocytes Absolute: 0.7 10*3/uL (ref 0.1–1.0)
Neutrophils Relative %: 44.3 % (ref 43.0–77.0)
Platelets: 253 10*3/uL (ref 150.0–400.0)
RDW: 15.5 % — ABNORMAL HIGH (ref 11.5–14.6)

## 2011-10-07 LAB — MICROALBUMIN / CREATININE URINE RATIO: Microalb Creat Ratio: 0.5 mg/g (ref 0.0–30.0)

## 2011-10-07 LAB — BASIC METABOLIC PANEL
BUN: 18 mg/dL (ref 6–23)
Chloride: 110 mEq/L (ref 96–112)
Creatinine, Ser: 1.1 mg/dL (ref 0.4–1.5)
Glucose, Bld: 103 mg/dL — ABNORMAL HIGH (ref 70–99)
Potassium: 4.8 mEq/L (ref 3.5–5.1)

## 2011-10-07 LAB — LIPID PANEL
Cholesterol: 287 mg/dL — ABNORMAL HIGH (ref 0–200)
Triglycerides: 127 mg/dL (ref 0.0–149.0)
VLDL: 25.4 mg/dL (ref 0.0–40.0)

## 2011-10-07 LAB — HEMOGLOBIN A1C: Hgb A1c MFr Bld: 6 % (ref 4.6–6.5)

## 2011-10-07 LAB — HEPATIC FUNCTION PANEL
ALT: 18 U/L (ref 0–53)
Total Bilirubin: 0.2 mg/dL — ABNORMAL LOW (ref 0.3–1.2)

## 2011-10-07 LAB — POCT URINALYSIS DIPSTICK
Bilirubin, UA: NEGATIVE
Blood, UA: NEGATIVE
Glucose, UA: NEGATIVE
Ketones, UA: NEGATIVE
Leukocytes, UA: NEGATIVE
Nitrite, UA: NEGATIVE

## 2011-10-07 LAB — LDL CHOLESTEROL, DIRECT: Direct LDL: 194.1 mg/dL

## 2011-10-07 LAB — TSH: TSH: 1.36 u[IU]/mL (ref 0.35–5.50)

## 2011-10-20 ENCOUNTER — Telehealth: Payer: Self-pay | Admitting: Internal Medicine

## 2011-10-20 NOTE — Telephone Encounter (Signed)
Pt called req lab results from [facility]. Pt is req nurse to give him a call back.

## 2011-10-20 NOTE — Telephone Encounter (Signed)
Left message with wife that we had called with labs results

## 2011-11-02 ENCOUNTER — Telehealth: Payer: Self-pay | Admitting: Internal Medicine

## 2011-11-02 ENCOUNTER — Other Ambulatory Visit: Payer: Self-pay | Admitting: *Deleted

## 2011-11-02 MED ORDER — SILDENAFIL CITRATE 100 MG PO TABS: 100.0000 mg | ORAL_TABLET | ORAL | Status: DC | PRN

## 2011-11-02 MED ORDER — OLMESARTAN MEDOXOMIL 20 MG PO TABS: 20.0000 mg | ORAL_TABLET | Freq: Every day | ORAL | Status: AC

## 2011-11-02 MED ORDER — ROSUVASTATIN CALCIUM 10 MG PO TABS: 10.0000 mg | ORAL_TABLET | Freq: Every day | ORAL | Status: AC

## 2011-11-02 NOTE — Telephone Encounter (Signed)
meds sent [facility] as wife requested

## 2011-11-02 NOTE — Telephone Encounter (Signed)
Well, number 1 he has to have a mail order med through his insurance and then find out what it is and we will send it. But he must find out what the mail order name is

## 2011-11-02 NOTE — Telephone Encounter (Signed)
Med sent to [facility]

## 2011-11-02 NOTE — Telephone Encounter (Signed)
Pt wants to know what he needs to do, in order to get his Viagra, bp and cholesterol meds sent to his home through mail order like his wife gets hers? Pls advise.

## 2011-11-09 ENCOUNTER — Telehealth: Payer: Self-pay | Admitting: Internal Medicine

## 2011-11-09 NOTE — Telephone Encounter (Signed)
Refill Crestor, Benicar to [facility] order. Thanks.

## 2011-11-09 NOTE — Telephone Encounter (Signed)
Please tell pt this was done on 11/7

## 2011-12-14 ENCOUNTER — Ambulatory Visit (INDEPENDENT_AMBULATORY_CARE_PROVIDER_SITE_OTHER): Payer: 59 | Admitting: Family Medicine

## 2011-12-14 ENCOUNTER — Encounter: Payer: Self-pay | Admitting: Family Medicine

## 2011-12-14 VITALS — BP 126/84 | HR 89 | Wt 203.0 lb

## 2011-12-14 DIAGNOSIS — H6980 Other specified disorders of Eustachian tube, unspecified ear: Secondary | ICD-10-CM

## 2011-12-14 DIAGNOSIS — H6990 Unspecified Eustachian tube disorder, unspecified ear: Secondary | ICD-10-CM

## 2011-12-14 MED ORDER — FEXOFENADINE-PSEUDOEPHED ER 180-240 MG PO TB24: 1.0000 | ORAL_TABLET | Freq: Every day | ORAL | Status: AC

## 2011-12-14 NOTE — Patient Instructions (Signed)
1. Allegra-D 24 hour 1 tab daily.   2. Call if symptoms worsen or persist. Barotitis Media Barotitis media is soreness (inflammation) of the area behind the eardrum (middle ear). This occurs when the auditory tube (Eustachian tube) leading from the back of the throat to the eardrum is blocked. When it is blocked air cannot move in and out of the middle ear to equalize pressure changes. These pressure changes come from changes in altitude when:  Flying.   Driving in the mountains.   Diving.  Problems are more likely to occur with pressure changes during times when you are congested as from:  Hay fever.   Upper respiratory infection.   A cold.  Damage or hearing loss (barotrauma) caused by this may be permanent. HOME CARE INSTRUCTIONS   Use medicines as recommended by your caregiver. Over the counter medicines will help unblock the canal and can help during times of air travel.   Do not put anything into your ears to clean or unplug them. Eardrops will not be helpful.   Do not swim, dive, or fly until your caregiver says it is all right to do so. If these activities are necessary, chewing gum with frequent swallowing may help. It is also helpful to hold your nose and gently blow to pop your ears for equalizing pressure changes. This forces air into the Eustachian tube.   For little ones with problems, give your baby a bottle of water or juice during periods when pressure changes would be anticipated such as during take offs and landings associated with air travel.   Only take over-the-counter or prescription medicines for pain, discomfort, or fever as directed by your caregiver.   A decongestant may be helpful in de-congesting the middle ear and make pressure equalization easier. This can be even more effective if the drops (spray) are delivered with the head lying over the edge of a bed with the head tilted toward the ear on the affected side.   If your caregiver has given you a  follow-up appointment, it is very important to keep that appointment. Not keeping the appointment could result in a chronic or permanent injury, pain, hearing loss and disability. If there is any problem keeping the appointment, you must call back to this facility for assistance.  SEEK IMMEDIATE MEDICAL CARE IF:   You develop a severe headache, dizziness, severe ear pain, or bloody or pus-like drainage from your ears.   An oral temperature above 102 F (38.9 C) develops.   Your problems do not improve or become worse.  MAKE SURE YOU:   Understand these instructions.   Will watch your condition.   Will get help right away if you are not doing well or get worse.  Document Released: 12/09/2000 Document Revised: 08/24/2011 Document Reviewed: 07/17/2008 [facility] Patient Information 2012 [place], [place].

## 2011-12-14 NOTE — Progress Notes (Signed)
  Subjective:    Patient ID: Brian Kaufman, male    DOB: [birth year], 60 y.o.   MRN: [id number]  HPI Comments: 60 year old black male, nonsmoker, in with complaints of dizziness and a headache that occurred 5 days ago lasting for several minutes. He has not had any issues with headache or dizziness since then. Does report feeling occasionally sluggish. Denies any sick contacts. Denies any sneezing, coughing, or congestion, fever, muscle aches, or pain.     Review of Systems  Constitutional: Negative.   HENT: Positive for congestion. Negative for facial swelling, drooling and postnasal drip.   Eyes: Negative.   Respiratory: Negative.   Cardiovascular: Negative.   Gastrointestinal: Negative.   Skin: Negative.   Hematological: Negative.        Objective:   Physical Exam  Constitutional: He is oriented to person, place, and time. He appears well-developed and well-nourished.  HENT:  Head: Normocephalic.  Right Ear: External ear normal.  Left Ear: External ear normal.       Large amount of fluid in the ears bilaterally.  Eyes: Pupils are equal, round, and reactive to light.  Neck: Normal range of motion. Neck supple.  Cardiovascular: Normal rate, regular rhythm and normal heart sounds.   Pulmonary/Chest: Effort normal and breath sounds normal.  Neurological: He is alert and oriented to person, place, and time.  Skin: Skin is warm and dry.          Assessment & Plan:  Assessment: Eustachian tube dysfunction  Plan: Allegra-D 24 hours 1 by mouth daily. Call if symptoms worsen or persist. Recheck as scheduled, and sooner when necessary

## 2012-01-13 ENCOUNTER — Encounter: Payer: Self-pay | Admitting: Internal Medicine

## 2012-01-13 ENCOUNTER — Ambulatory Visit (INDEPENDENT_AMBULATORY_CARE_PROVIDER_SITE_OTHER): Payer: 59 | Admitting: Internal Medicine

## 2012-01-13 VITALS — BP 164/98 | HR 68 | Temp 98.7°F | Wt 206.0 lb

## 2012-01-13 DIAGNOSIS — N411 Chronic prostatitis: Secondary | ICD-10-CM

## 2012-01-13 DIAGNOSIS — N410 Acute prostatitis: Secondary | ICD-10-CM

## 2012-01-13 MED ORDER — LEVOFLOXACIN 500 MG PO TABS: 500.0000 mg | ORAL_TABLET | Freq: Every day | ORAL | Status: DC

## 2012-01-13 NOTE — Progress Notes (Signed)
  Subjective:    Patient ID: Brian Kaufman, male    DOB: [birth year], 61 y.o.   MRN: [id number]  HPI Has a boil on his buttock. Has rectal irritations. Has pain in right testicle that is intermitant. Has some burning with urination. Hx of prostate infection.   Review of Systems  Constitutional: Negative for fever and fatigue.  HENT: Negative for hearing loss, congestion, neck pain and postnasal drip.   Eyes: Negative for discharge, redness and visual disturbance.  Respiratory: Negative for cough, shortness of breath and wheezing.   Cardiovascular: Negative for leg swelling.  Gastrointestinal: Negative for abdominal pain, constipation and abdominal distention.  Genitourinary: Negative for urgency and frequency.  Musculoskeletal: Negative for joint swelling and arthralgias.  Skin: Negative for color change and rash.  Neurological: Negative for weakness and light-headedness.  Hematological: Negative for adenopathy.  Psychiatric/Behavioral: Negative for behavioral problems.       Objective:   Physical Exam  Nursing note and vitals reviewed. Constitutional: He appears well-developed and well-nourished.  HENT:  Head: Normocephalic and atraumatic.  Eyes: Conjunctivae are normal. Pupils are equal, round, and reactive to light.  Neck: Normal range of motion. Neck supple.  Cardiovascular: Normal rate and regular rhythm.   Pulmonary/Chest: Effort normal and breath sounds normal.  Abdominal: Soft. Bowel sounds are normal.          Assessment & Plan:  Acute on chronic prostatitis Long discussion about the etiology of prostatitis.  I believe his blood pressures over the day because of the rectal pain he is experiencing he does not have a high temperature but he does have a low-grade fever he does feel ill and therefore we will choose Levaquin 500 mg by mouth daily and we will treat him for a full 30 days if this is recurrent he will be referred to a urologist for further  evaluation

## 2012-01-13 NOTE — Patient Instructions (Signed)
Take all 30 days of the antibiotic

## 2012-02-20 ENCOUNTER — Other Ambulatory Visit: Payer: Self-pay | Admitting: Internal Medicine

## 2012-02-21 ENCOUNTER — Other Ambulatory Visit: Payer: Self-pay | Admitting: Internal Medicine

## 2012-02-22 ENCOUNTER — Other Ambulatory Visit: Payer: Self-pay | Admitting: *Deleted

## 2012-02-22 ENCOUNTER — Telehealth: Payer: Self-pay | Admitting: Family Medicine

## 2012-02-22 NOTE — Telephone Encounter (Signed)
Please find out what he needs it for- he needs to know we just dont refill antibioitcs without knowing what is wrong/thanks

## 2012-02-22 NOTE — Telephone Encounter (Signed)
Pt called back and said that he still has prostate infection, that has not cleared up yet and pt is leaving to go out of town. Pls call in Levaquin asap today. Pt would like to be notified when this has been called in.

## 2012-02-22 NOTE — Telephone Encounter (Signed)
14 was given adn pt informed if this doesn't work will need ov to reevaluate

## 2012-02-22 NOTE — Telephone Encounter (Signed)
Left message for pt to call with details for reason for antibiotic.

## 2012-02-22 NOTE — Telephone Encounter (Signed)
Pt [facility] [place]. Would like a call when it's done, and then he'll pick up. Thanks.

## 2012-04-04 ENCOUNTER — Ambulatory Visit: Payer: Self-pay | Admitting: Internal Medicine

## 2012-05-16 ENCOUNTER — Ambulatory Visit: Payer: 59 | Admitting: Family Medicine

## 2012-05-16 DIAGNOSIS — Z0289 Encounter for other administrative examinations: Secondary | ICD-10-CM

## 2012-07-31 ENCOUNTER — Other Ambulatory Visit: Payer: Self-pay | Admitting: Internal Medicine

## 2013-04-17 ENCOUNTER — Other Ambulatory Visit: Payer: Self-pay | Admitting: Internal Medicine

## 2013-06-07 ENCOUNTER — Other Ambulatory Visit: Payer: Self-pay | Admitting: Internal Medicine

## 2014-08-27 ENCOUNTER — Encounter: Payer: Self-pay | Admitting: Gastroenterology

## 2015-01-01 ENCOUNTER — Telehealth: Payer: Self-pay | Admitting: Internal Medicine

## 2015-01-01 NOTE — Telephone Encounter (Signed)
Ok to schedule np in available new patient slot.

## 2015-01-01 NOTE — Telephone Encounter (Signed)
Pt would like to transfer to dr kim. Can I sch?

## 2015-01-05 NOTE — Telephone Encounter (Signed)
Pt has been sch

## 2015-01-06 ENCOUNTER — Ambulatory Visit: Payer: Self-pay | Admitting: Family Medicine

## 2015-02-11 ENCOUNTER — Other Ambulatory Visit: Payer: Self-pay | Admitting: Urology

## 2015-03-10 ENCOUNTER — Ambulatory Visit (HOSPITAL_COMMUNITY)
Admission: RE | Admit: 2015-03-10 | Discharge: 2015-03-10 | Disposition: A | Discharge status: Home / Self Care | Payer: 59 | Source: Ambulatory Visit | Attending: Urology | Admitting: Urology

## 2015-03-10 ENCOUNTER — Encounter (HOSPITAL_COMMUNITY): Payer: Self-pay

## 2015-03-10 ENCOUNTER — Encounter (HOSPITAL_COMMUNITY)
Admission: RE | Admit: 2015-03-10 | Discharge: 2015-03-10 | Disposition: A | Discharge status: Home / Self Care | Payer: 59 | Source: Ambulatory Visit | Attending: Urology | Admitting: Urology

## 2015-03-10 ENCOUNTER — Other Ambulatory Visit (HOSPITAL_COMMUNITY): Payer: Self-pay | Admitting: *Deleted

## 2015-03-10 DIAGNOSIS — N410 Acute prostatitis: Secondary | ICD-10-CM | POA: Diagnosis not present

## 2015-03-10 DIAGNOSIS — I509 Heart failure, unspecified: Secondary | ICD-10-CM | POA: Diagnosis not present

## 2015-03-10 DIAGNOSIS — Z01818 Encounter for other preprocedural examination: Secondary | ICD-10-CM | POA: Insufficient documentation

## 2015-03-10 HISTORY — DX: Malignant (primary) neoplasm, unspecified: C80.1

## 2015-03-10 HISTORY — DX: Gastro-esophageal reflux disease without esophagitis: K21.9

## 2015-03-10 HISTORY — DX: Essential (primary) hypertension: I10

## 2015-03-10 LAB — CBC
HCT: 38.9 % — ABNORMAL LOW (ref 39.0–52.0)
Hemoglobin: 12.5 g/dL — ABNORMAL LOW (ref 13.0–17.0)
MCH: 29.1 pg (ref 26.0–34.0)
MCHC: 32.1 g/dL (ref 30.0–36.0)
MCV: 90.7 fL (ref 78.0–100.0)
Platelets: 291 10*3/uL (ref 150–400)
RBC: 4.29 MIL/uL (ref 4.22–5.81)
RDW: 14.7 % (ref 11.5–15.5)
WBC: 6.6 10*3/uL (ref 4.0–10.5)

## 2015-03-10 LAB — BASIC METABOLIC PANEL
ANION GAP: 7 (ref 5–15)
BUN: 11 mg/dL (ref 6–23)
CALCIUM: 8.8 mg/dL (ref 8.4–10.5)
CHLORIDE: 107 mmol/L (ref 96–112)
CO2: 26 mmol/L (ref 19–32)
CREATININE: 1.15 mg/dL (ref 0.50–1.35)
GFR calc Af Amer: 76 mL/min — ABNORMAL LOW (ref >90)
GFR calc non Af Amer: 66 mL/min — ABNORMAL LOW (ref >90)
Glucose, Bld: 95 mg/dL (ref 70–99)
POTASSIUM: 4.7 mmol/L (ref 3.5–5.1)
Sodium: 140 mmol/L (ref 135–145)

## 2015-03-10 NOTE — Progress Notes (Signed)
   03/10/15 1400  OBSTRUCTIVE SLEEP APNEA  Have you ever been diagnosed with sleep apnea through a sleep study? No  Do you snore loudly (loud enough to be heard through closed doors)?  0  Do you often feel tired, fatigued, or sleepy during the daytime? 1  Has anyone observed you stop breathing during your sleep? 0  Do you have, or are you being treated for high blood pressure? 1  BMI more than 35 kg/m2? 0  Age over 64 years old? 1  Neck circumference greater than 40 cm/16 inches? 0  Gender: 1  Obstructive Sleep Apnea Score 4

## 2015-03-10 NOTE — Patient Instructions (Addendum)
Brian Kaufman  03/10/2015   Your procedure is scheduled on: Monday March 21st, 2016  Report to [facility] Main  Entrance and follow signs to               [place] at 515 AM.  Call this number if you have problems the morning of surgery [phone]   Remember:FOLLOW ALL BOWEL PREP INSTRUCTIONS FROM DR Alinda Money  Do not eat food :After Midnight Saturday night, clear liquids all day Sunday 03-15-15, no clear liquids after midnight Sunday night.     Take these medicines the morning of surgery with A SIP OF WATER: Crestor                               You may not have any metal on your body including hair pins and              piercings  Do not wear jewelry, make-up, lotions, powders or perfumes.             Do not wear nail polish.  Do not shave  48 hours prior to surgery.              Men may shave face and neck.   Do not bring valuables to the hospital. [place].  Contacts, dentures or bridgework may not be worn into surgery.  Leave suitcase in the car. After surgery it may be brought to your room.     Patients discharged the day of surgery will not be allowed to drive home.  Name and phone number of your driver:  Special Instructions: N/A              Please read over the following fact sheets you were given: _____________________________________________________________________             [facility] - Preparing for Surgery Before surgery, you can play an important role.  Because skin is not sterile, your skin needs to be as free of germs as possible.  You can reduce the number of germs on your skin by washing with CHG (chlorahexidine gluconate) soap before surgery.  CHG is an antiseptic cleaner which kills germs and bonds with the skin to continue killing germs even after washing. Please DO NOT use if you have an allergy to CHG or antibacterial soaps.  If your skin becomes reddened/irritated stop  using the CHG and inform your nurse when you arrive at Short Stay. Do not shave (including legs and underarms) for at least 48 hours prior to the first CHG shower.  You may shave your face/neck. Please follow these instructions carefully:  1.  Shower with CHG Soap the night before surgery and the  morning of Surgery.  2.  If you choose to wash your hair, wash your hair first as usual with your  normal  shampoo.  3.  After you shampoo, rinse your hair and body thoroughly to remove the  shampoo.                           4.  Use CHG as you would any other liquid soap.  You can apply chg directly  to the skin and wash  Gently with a scrungie or clean washcloth.  5.  Apply the CHG Soap to your body ONLY FROM THE NECK DOWN.   Do not use on face/ open                           Wound or open sores. Avoid contact with eyes, ears mouth and genitals (private parts).                       Wash face,  Genitals (private parts) with your normal soap.             6.  Wash thoroughly, paying special attention to the area where your surgery  will be performed.  7.  Thoroughly rinse your body with warm water from the neck down.  8.  DO NOT shower/wash with your normal soap after using and rinsing off  the CHG Soap.                9.  Pat yourself dry with a clean towel.            10.  Wear clean pajamas.            11.  Place clean sheets on your bed the night of your first shower and do not  sleep with pets. Day of Surgery : Do not apply any lotions/deodorants the morning of surgery.  Please wear clean clothes to the hospital/surgery center.  FAILURE TO FOLLOW THESE INSTRUCTIONS MAY RESULT IN THE CANCELLATION OF YOUR SURGERY PATIENT SIGNATURE_________________________________  NURSE SIGNATURE__________________________________  ________________________________________________________________________    CLEAR LIQUID DIET   Foods Allowed                                                                      Foods Excluded  Coffee and tea, regular and decaf                             liquids that you cannot  Plain Jell-O in any flavor                                             see through such as: Fruit ices (not with fruit pulp)                                     milk, soups, orange juice  Iced Popsicles                                    All solid food Carbonated beverages, regular and diet                                    Cranberry, grape and apple juices Sports drinks like Gatorade Lightly seasoned clear broth or consume(fat free) Sugar, honey syrup  Sample Menu Breakfast                                Lunch                                     Supper Cranberry juice                    Beef broth                            Chicken broth Jell-O                                     Grape juice                           Apple juice Coffee or tea                        Jell-O                                      Popsicle                                                Coffee or tea                        Coffee or tea  _____________________________________________________________________    Incentive Spirometer  An incentive spirometer is a tool that can help keep your lungs clear and active. This tool measures how well you are filling your lungs with each breath. Taking long deep breaths may help reverse or decrease the chance of developing breathing (pulmonary) problems (especially infection) following:  A long period of time when you are unable to move or be active. BEFORE THE PROCEDURE   If the spirometer includes an indicator to show your best effort, your nurse or respiratory therapist will set it to a desired goal.  If possible, sit up straight or lean slightly forward. Try not to slouch.  Hold the incentive spirometer in an upright position. INSTRUCTIONS FOR USE   Sit on the edge of your bed if possible, or sit up as far as you can in bed or on a  chair.  Hold the incentive spirometer in an upright position.  Breathe out normally.  Place the mouthpiece in your mouth and seal your lips tightly around it.  Breathe in slowly and as deeply as possible, raising the piston or the ball toward the top of the column.  Hold your breath for 3-5 seconds or for as long as possible. Allow the piston or ball to fall to the bottom of the column.  Remove the mouthpiece from your mouth and breathe out normally.  Rest for a few seconds and repeat Steps 1 through 7 at least 10 times every 1-2 hours when you are awake. Take your time and take a few normal breaths between  deep breaths.  The spirometer may include an indicator to show your best effort. Use the indicator as a goal to work toward during each repetition.  After each set of 10 deep breaths, practice coughing to be sure your lungs are clear. If you have an incision (the cut made at the time of surgery), support your incision when coughing by placing a pillow or rolled up towels firmly against it. Once you are able to get out of bed, walk around indoors and cough well. You may stop using the incentive spirometer when instructed by your caregiver.  RISKS AND COMPLICATIONS  Take your time so you do not get dizzy or light-headed.  If you are in pain, you may need to take or ask for pain medication before doing incentive spirometry. It is harder to take a deep breath if you are having pain. AFTER USE  Rest and breathe slowly and easily.  It can be helpful to keep track of a log of your progress. Your caregiver can provide you with a simple table to help with this. If you are using the spirometer at home, follow these instructions: [place] IF:   You are having difficultly using the spirometer.  You have trouble using the spirometer as often as instructed.  Your pain medication is not giving enough relief while using the spirometer.  You develop fever of 100.5 F (38.1 C) or  higher. SEEK IMMEDIATE MEDICAL CARE IF:   You cough up bloody sputum that had not been present before.  You develop fever of 102 F (38.9 C) or greater.  You develop worsening pain at or near the incision site. MAKE SURE YOU:   Understand these instructions.  Will watch your condition.  Will get help right away if you are not doing well or get worse. Document Released: 04/24/2007 Document Revised: 03/05/2012 Document Reviewed: 06/25/2007 ExitCare Patient Information 2014 [employer], [place].   ________________________________________________________________________  WHAT IS A BLOOD TRANSFUSION? Blood Transfusion Information  A transfusion is the replacement of blood or some of its parts. Blood is made up of multiple cells which provide different functions.  Red blood cells carry oxygen and are used for blood loss replacement.  White blood cells fight against infection.  Platelets control bleeding.  Plasma helps clot blood.  Other blood products are available for specialized needs, such as hemophilia or other clotting disorders. BEFORE THE TRANSFUSION  Who gives blood for transfusions?   Healthy volunteers who are fully evaluated to make sure their blood is safe. This is blood bank blood. Transfusion therapy is the safest it has ever been in the practice of medicine. Before blood is taken from a donor, a complete history is taken to make sure that person has no history of diseases nor engages in risky social behavior (examples are intravenous drug use or sexual activity with multiple partners). The donor's travel history is screened to minimize risk of transmitting infections, such as malaria. The donated blood is tested for signs of infectious diseases, such as HIV and hepatitis. The blood is then tested to be sure it is compatible with you in order to minimize the chance of a transfusion reaction. If you or a relative donates blood, this is often done in anticipation of surgery  and is not appropriate for emergency situations. It takes many days to process the donated blood. RISKS AND COMPLICATIONS Although transfusion therapy is very safe and saves many lives, the main dangers of transfusion include:   Getting an infectious disease.  Developing a transfusion reaction. This is an allergic reaction to something in the blood you were given. Every precaution is taken to prevent this. The decision to have a blood transfusion has been considered carefully by your caregiver before blood is given. Blood is not given unless the benefits outweigh the risks. AFTER THE TRANSFUSION  Right after receiving a blood transfusion, you will usually feel much better and more energetic. This is especially true if your red blood cells have gotten low (anemic). The transfusion raises the level of the red blood cells which carry oxygen, and this usually causes an energy increase.  The nurse administering the transfusion will monitor you carefully for complications. HOME CARE INSTRUCTIONS  No special instructions are needed after a transfusion. You may find your energy is better. Speak with your caregiver about any limitations on activity for underlying diseases you may have. SEEK MEDICAL CARE IF:   Your condition is not improving after your transfusion.  You develop redness or irritation at the intravenous (IV) site. SEEK IMMEDIATE MEDICAL CARE IF:  Any of the following symptoms occur over the next 12 hours:  Shaking chills.  You have a temperature by mouth above 102 F (38.9 C), not controlled by medicine.  Chest, back, or muscle pain.  People around you feel you are not acting correctly or are confused.  Shortness of breath or difficulty breathing.  Dizziness and fainting.  You get a rash or develop hives.  You have a decrease in urine output.  Your urine turns a dark color or changes to pink, red, or brown. Any of the following symptoms occur over the next 10  days:  You have a temperature by mouth above 102 F (38.9 C), not controlled by medicine.  Shortness of breath.  Weakness after normal activity.  The white part of the eye turns yellow (jaundice).  You have a decrease in the amount of urine or are urinating less often.  Your urine turns a dark color or changes to pink, red, or brown. Document Released: 12/09/2000 Document Revised: 03/05/2012 Document Reviewed: 07/28/2008 [place] Patient Information 2014 [place], [place].  _______________________________________________________________________

## 2015-03-11 NOTE — Progress Notes (Signed)
Chest xray results from 03-10-15 faxed to dr borden fax [phone] and [facility] fax number also, message left with selita bradsher concerning abnormal chest xraytresults faxed to dr borden.

## 2015-03-12 ENCOUNTER — Ambulatory Visit (HOSPITAL_COMMUNITY)
Admission: RE | Admit: 2015-03-12 | Discharge: 2015-03-12 | Disposition: A | Discharge status: Home / Self Care | Payer: 59 | Source: Ambulatory Visit | Attending: Urology | Admitting: Urology

## 2015-03-12 ENCOUNTER — Other Ambulatory Visit (HOSPITAL_COMMUNITY): Payer: Self-pay | Admitting: Urology

## 2015-03-12 DIAGNOSIS — Z01818 Encounter for other preprocedural examination: Secondary | ICD-10-CM | POA: Insufficient documentation

## 2015-03-12 DIAGNOSIS — C61 Malignant neoplasm of prostate: Secondary | ICD-10-CM | POA: Insufficient documentation

## 2015-03-12 LAB — ABO/RH: ABO/RH(D): O POS

## 2015-03-13 NOTE — H&P (Signed)
  Chief Complaint Prostate Cancer     History of Present Illness [name] is a 64 year old gentleman who was noted to have an elevated PSA of 5.1 prompting a prostate needle biopsy by Dr. Laural Golden on 01/13/15. 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 5 out of 6 biopsy regions were positive for malignancy. He follows up today for a second opinion regarding his treatment options for prostate cancer. Overall, he is relatively healthy. He has a history of hypertension, dyslipidemia, and diet-controlled diabetes.   TNM stage: cT1c Nx Mx PSA: 5.1 Gleason score: 3+4=7 Biopsy (01/13/15): 5 out of 6 biopsy regions   Left: L mid (< 5% of tissue, 3+3=6), L base (< 5% of tissue, 3+3=6),    Right: R apex (80% of tissue, 3+4=7), R mid (60% of tissue, 3+4=7), R base (20% of tissue, 3+3=6) Prostate volume: 38 cc  Urinary function: He does have moderate lower urinary tract symptoms. IPSS score is 14/3. Erectile function: He does have erectile dysfunction. He is adequately treated with Viagra 50 mg. SHIM score is 19.   Past Medical History Problems  1. History of Diet-controlled diabetes mellitus (E11.9) 2. History of Dyslipidemia (E78.5) 3. History of hypertension (Z86.79)  Surgical History Problems  1. History of No Surgical Problems  Current Meds 1. Benicar 40 MG Oral Tablet;  Therapy: (Recorded:02Feb2016) to Recorded 2. Crestor 10 MG Oral Tablet;  Therapy: (Recorded:02Feb2016) to Recorded  Allergies Medication  1. Codeine Derivatives  Family History Problems  1. Denied: Family history of prostate cancer 2. No pertinent family history : Mother  Social History Problems    Never a smoker  Review of Systems Genitourinary, constitutional, skin, eye, otolaryngeal, hematologic/lymphatic, cardiovascular, pulmonary, endocrine, musculoskeletal, gastrointestinal, neurological and psychiatric system(s) were reviewed and pertinent findings if present are noted and are  otherwise negative.  Musculoskeletal: back pain.    Vitals  Height: 5 ft 6 in Weight: 185 lb  BMI Calculated: 29.86 BSA Calculated: 1.93   Physical Exam Constitutional: Well nourished and well developed . No acute distress.  ENT:. The ears and nose are normal in appearance.  Neck: The appearance of the neck is normal and no neck mass is present.  Pulmonary: No respiratory distress, normal respiratory rhythm and effort and clear bilateral breath sounds.  Cardiovascular: Heart rate and rhythm are normal . No peripheral edema.  Abdomen: The abdomen is soft and nontender. No masses are palpated. No CVA tenderness. No hernias are palpable. No hepatosplenomegaly noted.    Assessment Assessed  1. Prostate cancer (C61)    Discussion/Summary 1. Intermediate risk localized prostate cancer: He has elected to proceed with a bilateral nerve sparing robotic-assisted laparoscopic radical prostatectomy and bilateral pelvic lymphadenectomy.

## 2015-03-15 ENCOUNTER — Encounter (HOSPITAL_COMMUNITY): Payer: Self-pay | Admitting: Anesthesiology

## 2015-03-15 NOTE — Anesthesia Preprocedure Evaluation (Addendum)
Anesthesia Evaluation  Patient identified by MRN, date of birth, ID band Patient awake    Reviewed: Allergy & Precautions, NPO status , Patient's Chart, lab work & pertinent test results  Airway Mallampati: II  TM Distance: >3 FB Neck ROM: Full    Dental no notable dental hx.    Pulmonary neg pulmonary ROS,  breath sounds clear to auscultation  Pulmonary exam normal       Cardiovascular hypertension, Pt. on medications +CHF Rhythm:Regular Rate:Normal     Neuro/Psych negative neurological ROS  negative psych ROS   GI/Hepatic Neg liver ROS, GERD-  ,  Endo/Other  negative endocrine ROS  Renal/GU negative Renal ROS  negative genitourinary   Musculoskeletal negative musculoskeletal ROS (+)   Abdominal (+) + obese,   Peds negative pediatric ROS (+)  Hematology negative hematology ROS (+)   Anesthesia Other Findings   Reproductive/Obstetrics negative OB ROS                            Anesthesia Physical Anesthesia Plan  ASA: II  Anesthesia Plan: General   Post-op Pain Management:    Induction: Intravenous  Airway Management Planned: Oral ETT  Additional Equipment:   Intra-op Plan:   Post-operative Plan: Extubation in OR  Informed Consent: I have reviewed the patients History and Physical, chart, labs and discussed the procedure including the risks, benefits and alternatives for the proposed anesthesia with the patient or authorized representative who has indicated his/her understanding and acceptance.   Dental advisory given  Plan Discussed with: CRNA  Anesthesia Plan Comments:         Anesthesia Quick Evaluation

## 2015-03-16 ENCOUNTER — Encounter (HOSPITAL_COMMUNITY)
Admission: RE | Disposition: A | Discharge status: Home / Self Care | Payer: Self-pay | Source: Ambulatory Visit | Attending: Urology

## 2015-03-16 ENCOUNTER — Encounter (HOSPITAL_COMMUNITY): Payer: Self-pay | Admitting: *Deleted

## 2015-03-16 ENCOUNTER — Ambulatory Visit (HOSPITAL_COMMUNITY): Payer: 59 | Admitting: Anesthesiology

## 2015-03-16 ENCOUNTER — Inpatient Hospital Stay (HOSPITAL_COMMUNITY)
Admission: RE | Admit: 2015-03-16 | Discharge: 2015-03-17 | DRG: 708 | Disposition: A | Discharge status: Home / Self Care | Payer: 59 | Source: Ambulatory Visit | Attending: Urology | Admitting: Urology

## 2015-03-16 DIAGNOSIS — E119 Type 2 diabetes mellitus without complications: Secondary | ICD-10-CM | POA: Diagnosis present

## 2015-03-16 DIAGNOSIS — Z79899 Other long term (current) drug therapy: Secondary | ICD-10-CM

## 2015-03-16 DIAGNOSIS — E785 Hyperlipidemia, unspecified: Secondary | ICD-10-CM | POA: Diagnosis present

## 2015-03-16 DIAGNOSIS — I10 Essential (primary) hypertension: Secondary | ICD-10-CM | POA: Diagnosis present

## 2015-03-16 DIAGNOSIS — N529 Male erectile dysfunction, unspecified: Secondary | ICD-10-CM | POA: Diagnosis present

## 2015-03-16 DIAGNOSIS — C61 Malignant neoplasm of prostate: Secondary | ICD-10-CM | POA: Diagnosis present

## 2015-03-16 HISTORY — PX: ROBOT ASSISTED LAPAROSCOPIC RADICAL PROSTATECTOMY: SHX5141

## 2015-03-16 HISTORY — PX: LYMPHADENECTOMY: SHX5960

## 2015-03-16 LAB — HEMOGLOBIN AND HEMATOCRIT, BLOOD
HCT: 35.8 % — ABNORMAL LOW (ref 39.0–52.0)
Hemoglobin: 11.4 g/dL — ABNORMAL LOW (ref 13.0–17.0)

## 2015-03-16 LAB — TYPE AND SCREEN
ABO/RH(D): O POS
ABO/RH(D): O POS
ANTIBODY SCREEN: NEGATIVE
Antibody Screen: NEGATIVE

## 2015-03-16 SURGERY — ROBOTIC ASSISTED LAPAROSCOPIC RADICAL PROSTATECTOMY LEVEL 2
Anesthesia: General

## 2015-03-16 MED ORDER — HEPARIN SODIUM (PORCINE) 1000 UNIT/ML IJ SOLN
INTRAMUSCULAR | Status: AC
  Filled 2015-03-16: qty 1

## 2015-03-16 MED ORDER — GLYCOPYRROLATE 0.2 MG/ML IJ SOLN
INTRAMUSCULAR | Status: AC
Start: 2015-03-16 — End: 2015-03-16
  Filled 2015-03-16: qty 2

## 2015-03-16 MED ORDER — KCL IN DEXTROSE-NACL 20-5-0.45 MEQ/L-%-% IV SOLN
INTRAVENOUS | Status: AC
  Filled 2015-03-16: qty 1000

## 2015-03-16 MED ORDER — MORPHINE SULFATE 2 MG/ML IJ SOLN
2.0000 mg | INTRAMUSCULAR | Status: DC | PRN
  Administered 2015-03-16 (×2): 4 mg via INTRAVENOUS
  Filled 2015-03-16 (×2): qty 2

## 2015-03-16 MED ORDER — LIDOCAINE HCL (CARDIAC) 20 MG/ML IV SOLN
INTRAVENOUS | Status: AC
  Filled 2015-03-16: qty 5

## 2015-03-16 MED ORDER — LIP MEDEX EX OINT
TOPICAL_OINTMENT | CUTANEOUS | Status: DC | PRN
  Administered 2015-03-16: 1 via TOPICAL
  Filled 2015-03-16 (×2): qty 7

## 2015-03-16 MED ORDER — HYDROMORPHONE HCL 1 MG/ML IJ SOLN
0.2500 mg | INTRAMUSCULAR | Status: DC | PRN
  Administered 2015-03-16 (×4): 0.5 mg via INTRAVENOUS

## 2015-03-16 MED ORDER — HYDROCODONE-ACETAMINOPHEN 5-325 MG PO TABS: 1.0000 | ORAL_TABLET | Freq: Four times a day (QID) | ORAL | Status: AC | PRN

## 2015-03-16 MED ORDER — BUPIVACAINE-EPINEPHRINE (PF) 0.25% -1:200000 IJ SOLN
INTRAMUSCULAR | Status: AC
  Filled 2015-03-16: qty 30

## 2015-03-16 MED ORDER — ONDANSETRON HCL 4 MG/2ML IJ SOLN
INTRAMUSCULAR | Status: DC | PRN
  Administered 2015-03-16: 4 mg via INTRAVENOUS

## 2015-03-16 MED ORDER — HYDROMORPHONE HCL 2 MG/ML IJ SOLN
INTRAMUSCULAR | Status: AC
  Filled 2015-03-16: qty 1

## 2015-03-16 MED ORDER — FENTANYL CITRATE 0.05 MG/ML IJ SOLN
INTRAMUSCULAR | Status: AC
  Filled 2015-03-16: qty 5

## 2015-03-16 MED ORDER — CISATRACURIUM BESYLATE 20 MG/10ML IV SOLN
INTRAVENOUS | Status: AC
  Filled 2015-03-16: qty 10

## 2015-03-16 MED ORDER — CEFAZOLIN SODIUM-DEXTROSE 2-3 GM-% IV SOLR
2.0000 g | INTRAVENOUS | Status: AC
  Administered 2015-03-16: 2 g via INTRAVENOUS

## 2015-03-16 MED ORDER — BUPIVACAINE-EPINEPHRINE 0.25% -1:200000 IJ SOLN
INTRAMUSCULAR | Status: DC | PRN
  Administered 2015-03-16: 21 mL
  Administered 2015-03-16: 30 mL

## 2015-03-16 MED ORDER — HYDROMORPHONE HCL 1 MG/ML IJ SOLN
INTRAMUSCULAR | Status: DC | PRN
  Administered 2015-03-16: 1 mg via INTRAVENOUS
  Administered 2015-03-16 (×2): 0.5 mg via INTRAVENOUS

## 2015-03-16 MED ORDER — NEOSTIGMINE METHYLSULFATE 10 MG/10ML IV SOLN
INTRAVENOUS | Status: DC | PRN
  Administered 2015-03-16: 6 mg via INTRAVENOUS

## 2015-03-16 MED ORDER — CISATRACURIUM BESYLATE (PF) 10 MG/5ML IV SOLN
INTRAVENOUS | Status: DC | PRN
  Administered 2015-03-16: 10 mg via INTRAVENOUS
  Administered 2015-03-16: 4 mg via INTRAVENOUS
  Administered 2015-03-16: 6 mg via INTRAVENOUS

## 2015-03-16 MED ORDER — DOCUSATE SODIUM 100 MG PO CAPS
100.0000 mg | ORAL_CAPSULE | Freq: Two times a day (BID) | ORAL | Status: DC
  Administered 2015-03-16 – 2015-03-17 (×2): 100 mg via ORAL
  Filled 2015-03-16 (×2): qty 1

## 2015-03-16 MED ORDER — SODIUM CHLORIDE 0.9 % IV BOLUS (SEPSIS)
1000.0000 mL | Freq: Once | INTRAVENOUS | Status: AC
  Administered 2015-03-16: 1000 mL via INTRAVENOUS

## 2015-03-16 MED ORDER — LACTATED RINGERS IV SOLN
INTRAVENOUS | Status: DC | PRN
  Administered 2015-03-16: 1000 mL

## 2015-03-16 MED ORDER — CIPROFLOXACIN HCL 500 MG PO TABS: 500.0000 mg | ORAL_TABLET | Freq: Two times a day (BID) | ORAL | Status: AC

## 2015-03-16 MED ORDER — LACTATED RINGERS IV SOLN
INTRAVENOUS | Status: DC | PRN
  Administered 2015-03-16: 07:00:00 via INTRAVENOUS

## 2015-03-16 MED ORDER — SUCCINYLCHOLINE CHLORIDE 20 MG/ML IJ SOLN
INTRAMUSCULAR | Status: DC | PRN
  Administered 2015-03-16: 100 mg via INTRAVENOUS

## 2015-03-16 MED ORDER — DIPHENHYDRAMINE HCL 50 MG/ML IJ SOLN: 12.5000 mg | Freq: Four times a day (QID) | INTRAMUSCULAR | Status: DC | PRN

## 2015-03-16 MED ORDER — MIDAZOLAM HCL 5 MG/5ML IJ SOLN
INTRAMUSCULAR | Status: DC | PRN
  Administered 2015-03-16: 2 mg via INTRAVENOUS

## 2015-03-16 MED ORDER — STERILE WATER FOR IRRIGATION IR SOLN
Status: DC | PRN
  Administered 2015-03-16: 3000 mL

## 2015-03-16 MED ORDER — LABETALOL HCL 5 MG/ML IV SOLN
INTRAVENOUS | Status: DC | PRN
  Administered 2015-03-16 (×2): 5 mg via INTRAVENOUS

## 2015-03-16 MED ORDER — LIDOCAINE HCL 1 % IJ SOLN
INTRAMUSCULAR | Status: DC | PRN
  Administered 2015-03-16: 80 mg via INTRADERMAL

## 2015-03-16 MED ORDER — CEFAZOLIN SODIUM 1-5 GM-% IV SOLN
1.0000 g | Freq: Three times a day (TID) | INTRAVENOUS | Status: AC
  Administered 2015-03-16 – 2015-03-17 (×2): 1 g via INTRAVENOUS
  Filled 2015-03-16 (×3): qty 50

## 2015-03-16 MED ORDER — ACETAMINOPHEN 325 MG PO TABS: 650.0000 mg | ORAL_TABLET | ORAL | Status: DC | PRN

## 2015-03-16 MED ORDER — CEFAZOLIN SODIUM-DEXTROSE 2-3 GM-% IV SOLR
INTRAVENOUS | Status: AC
  Filled 2015-03-16: qty 50

## 2015-03-16 MED ORDER — LABETALOL HCL 5 MG/ML IV SOLN
INTRAVENOUS | Status: AC
  Filled 2015-03-16: qty 4

## 2015-03-16 MED ORDER — ROSUVASTATIN CALCIUM 10 MG PO TABS
10.0000 mg | ORAL_TABLET | Freq: Every day | ORAL | Status: DC
  Administered 2015-03-16 – 2015-03-17 (×2): 10 mg via ORAL
  Filled 2015-03-16 (×2): qty 1

## 2015-03-16 MED ORDER — ONDANSETRON HCL 4 MG/2ML IJ SOLN
INTRAMUSCULAR | Status: AC
  Filled 2015-03-16: qty 2

## 2015-03-16 MED ORDER — LACTATED RINGERS IV SOLN: INTRAVENOUS | Status: DC

## 2015-03-16 MED ORDER — KETOROLAC TROMETHAMINE 15 MG/ML IJ SOLN
INTRAMUSCULAR | Status: AC
  Filled 2015-03-16: qty 1

## 2015-03-16 MED ORDER — DIPHENHYDRAMINE HCL 12.5 MG/5ML PO ELIX: 12.5000 mg | ORAL_SOLUTION | Freq: Four times a day (QID) | ORAL | Status: DC | PRN

## 2015-03-16 MED ORDER — KETOROLAC TROMETHAMINE 15 MG/ML IJ SOLN
15.0000 mg | Freq: Four times a day (QID) | INTRAMUSCULAR | Status: DC
  Administered 2015-03-16 – 2015-03-17 (×4): 15 mg via INTRAVENOUS
  Filled 2015-03-16 (×6): qty 1

## 2015-03-16 MED ORDER — KCL IN DEXTROSE-NACL 20-5-0.45 MEQ/L-%-% IV SOLN
INTRAVENOUS | Status: DC
  Administered 2015-03-16 – 2015-03-17 (×3): via INTRAVENOUS
  Filled 2015-03-16 (×4): qty 1000

## 2015-03-16 MED ORDER — NEOSTIGMINE METHYLSULFATE 10 MG/10ML IV SOLN
INTRAVENOUS | Status: AC
  Filled 2015-03-16: qty 1

## 2015-03-16 MED ORDER — GLYCOPYRROLATE 0.2 MG/ML IJ SOLN
INTRAMUSCULAR | Status: DC | PRN
  Administered 2015-03-16: 0.4 mg via INTRAVENOUS

## 2015-03-16 MED ORDER — PROPOFOL 10 MG/ML IV BOLUS
INTRAVENOUS | Status: AC
  Filled 2015-03-16: qty 20

## 2015-03-16 MED ORDER — HYDROMORPHONE HCL 1 MG/ML IJ SOLN
INTRAMUSCULAR | Status: AC
  Filled 2015-03-16: qty 2

## 2015-03-16 MED ORDER — FENTANYL CITRATE 0.05 MG/ML IJ SOLN
INTRAMUSCULAR | Status: DC | PRN
  Administered 2015-03-16: 150 ug via INTRAVENOUS
  Administered 2015-03-16: 100 ug via INTRAVENOUS

## 2015-03-16 MED ORDER — BUPIVACAINE-EPINEPHRINE 0.25% -1:200000 IJ SOLN
INTRAMUSCULAR | Status: AC
  Filled 2015-03-16: qty 1

## 2015-03-16 MED ORDER — PROPOFOL 10 MG/ML IV BOLUS
INTRAVENOUS | Status: DC | PRN
  Administered 2015-03-16: 20 mg via INTRAVENOUS
  Administered 2015-03-16: 80 mg via INTRAVENOUS

## 2015-03-16 MED ORDER — MIDAZOLAM HCL 2 MG/2ML IJ SOLN
INTRAMUSCULAR | Status: AC
  Filled 2015-03-16: qty 2

## 2015-03-16 MED ORDER — SODIUM CHLORIDE 0.9 % IR SOLN
Status: DC | PRN
  Administered 2015-03-16: 300 mL via INTRAVESICAL

## 2015-03-16 SURGICAL SUPPLY — 50 items
CABLE HIGH FREQUENCY MONO STRZ (ELECTRODE) ×4 IMPLANT
CATH FOLEY 2WAY SLVR 18FR 30CC (CATHETERS) ×4 IMPLANT
CATH ROBINSON RED A/P 16FR (CATHETERS) ×4 IMPLANT
CATH ROBINSON RED A/P 8FR (CATHETERS) ×4 IMPLANT
CATH TIEMANN FOLEY 18FR 5CC (CATHETERS) ×4 IMPLANT
CHLORAPREP W/TINT 26ML (MISCELLANEOUS) ×4 IMPLANT
CLIP LIGATING HEM O LOK PURPLE (MISCELLANEOUS) ×8 IMPLANT
CLOTH BEACON ORANGE TIMEOUT ST (SAFETY) ×4 IMPLANT
COVER SURGICAL LIGHT HANDLE (MISCELLANEOUS) ×4 IMPLANT
COVER TIP SHEARS 8 DVNC (MISCELLANEOUS) ×2 IMPLANT
COVER TIP SHEARS 8MM DA VINCI (MISCELLANEOUS) ×2
CUTTER ECHEON FLEX ENDO 45 340 (ENDOMECHANICALS) ×4 IMPLANT
DECANTER SPIKE VIAL GLASS SM (MISCELLANEOUS) IMPLANT
DRAPE SURG IRRIG POUCH 19X23 (DRAPES) ×4 IMPLANT
DRSG TEGADERM 4X4.75 (GAUZE/BANDAGES/DRESSINGS) ×4 IMPLANT
DRSG TEGADERM 6X8 (GAUZE/BANDAGES/DRESSINGS) ×8 IMPLANT
ELECT REM PT RETURN 9FT ADLT (ELECTROSURGICAL) ×4
ELECTRODE REM PT RTRN 9FT ADLT (ELECTROSURGICAL) ×2 IMPLANT
GLOVE BIO SURGEON STRL SZ 6.5 (GLOVE) ×3 IMPLANT
GLOVE BIO SURGEONS STRL SZ 6.5 (GLOVE) ×1
GLOVE BIOGEL M STRL SZ7.5 (GLOVE) ×8 IMPLANT
GOWN STRL REUS W/TWL LRG LVL3 (GOWN DISPOSABLE) ×12 IMPLANT
HOLDER FOLEY CATH W/STRAP (MISCELLANEOUS) ×4 IMPLANT
IV LACTATED RINGERS 1000ML (IV SOLUTION) IMPLANT
KIT ACCESSORY DA VINCI DISP (KITS) ×2
KIT ACCESSORY DVNC DISP (KITS) ×2 IMPLANT
LIQUID BAND (GAUZE/BANDAGES/DRESSINGS) ×4 IMPLANT
MANIFOLD NEPTUNE II (INSTRUMENTS) ×4 IMPLANT
NDL SAFETY ECLIPSE 18X1.5 (NEEDLE) ×2 IMPLANT
NEEDLE HYPO 18GX1.5 SHARP (NEEDLE) ×2
PACK ROBOT UROLOGY CUSTOM (CUSTOM PROCEDURE TRAY) ×4 IMPLANT
RELOAD GREEN ECHELON 45 (STAPLE) ×4 IMPLANT
SET TUBE IRRIG SUCTION NO TIP (IRRIGATION / IRRIGATOR) ×4 IMPLANT
SHEET LAVH (DRAPES) ×4 IMPLANT
SOLUTION ELECTROLUBE (MISCELLANEOUS) ×4 IMPLANT
SUT ETHILON 3 0 PS 1 (SUTURE) ×4 IMPLANT
SUT MNCRL 3 0 RB1 (SUTURE) ×2 IMPLANT
SUT MNCRL 3 0 VIOLET RB1 (SUTURE) ×2 IMPLANT
SUT MNCRL AB 4-0 PS2 18 (SUTURE) ×8 IMPLANT
SUT MONOCRYL 3 0 RB1 (SUTURE) ×4
SUT VIC AB 0 CT1 27 (SUTURE) ×2
SUT VIC AB 0 CT1 27XBRD ANTBC (SUTURE) ×2 IMPLANT
SUT VIC AB 0 UR5 27 (SUTURE) ×4 IMPLANT
SUT VIC AB 2-0 SH 27 (SUTURE) ×2
SUT VIC AB 2-0 SH 27X BRD (SUTURE) ×2 IMPLANT
SUT VICRYL 0 UR6 27IN ABS (SUTURE) ×8 IMPLANT
SYR 27GX1/2 1ML LL SAFETY (SYRINGE) ×4 IMPLANT
TOWEL OR 17X26 10 PK STRL BLUE (TOWEL DISPOSABLE) ×4 IMPLANT
TOWEL OR NON WOVEN STRL DISP B (DISPOSABLE) ×4 IMPLANT
WATER STERILE IRR 1500ML POUR (IV SOLUTION) IMPLANT

## 2015-03-16 NOTE — Transfer of Care (Signed)
Immediate Anesthesia Transfer of Care Note  Patient: Brian Kaufman  Procedure(s) Performed: Procedure(s): ROBOTIC ASSISTED LAPAROSCOPIC RADICAL PROSTATECTOMY LEVEL 2 (N/A) BILATERAL PELVIC LYMPHADENECTOMY (Bilateral)  Patient Location: PACU  Anesthesia Type:General  Level of Consciousness: awake, alert  and oriented  Airway & Oxygen Therapy: Patient Spontanous Breathing and Patient connected to face mask oxygen  Post-op Assessment: Report given to RN  Post vital signs: Reviewed and stable  Last Vitals:  Filed Vitals:   03/16/15 0511  BP: 146/72  Pulse: 75  Temp: 36.3 C  Resp: 20    Complications: No apparent anesthesia complications

## 2015-03-16 NOTE — Op Note (Signed)

## 2015-03-16 NOTE — Progress Notes (Signed)
Day of Surgery Subjective: No issues post-operatively. No nausea/vomiting. Pain controlled.   Objective: Vital signs in last 24 hours: Temp:  [97.3 F (36.3 C)-98.1 F (36.7 C)] 97.5 F (36.4 C) (03/21 1449) Pulse Rate:  [52-75] 53 (03/21 1449) Resp:  [10-20] 16 (03/21 1449) BP: (146-182)/(68-77) 154/77 mmHg (03/21 1449) SpO2:  [100 %] 100 % (03/21 1449) FiO2 (%):  [2 %] 2 % (03/21 1449) Weight:  [90.266 kg (199 lb)] 90.266 kg (199 lb) (03/21 0619)  Intake/Output this shift: Total I/O In: 2907.5 [P.O.:60; I.V.:1847.5; IV Piggyback:1000] Out: 100 [Blood:100]  Physical Exam:  General: Alert and oriented CV: RRR Lungs: Clear Abdomen: Soft, ND Incisions: C/D/I JP serosanginous Foley: clear yellow Ext: NT, No erythema  Lab Results:  Recent Labs  03/16/15 1245  HGB 11.4*  HCT 35.8*   BMET No results for input(s): NA, K, CL, CO2, GLUCOSE, BUN, CREATININE, CALCIUM in the last 72 hours.   Studies/Results: No results found.  Assessment/Plan: 63M POD#0 from uncomplicated RALP, doing well. Continue to monitor on standard pathway.    LOS: 0 days   Wynetta Emery, Liddy Deam C 03/16/2015, 4:04 PM

## 2015-03-16 NOTE — Anesthesia Postprocedure Evaluation (Signed)
  Anesthesia Post-op Note  Patient: Brian Kaufman  Procedure(s) Performed: Procedure(s) (LRB): ROBOTIC ASSISTED LAPAROSCOPIC RADICAL PROSTATECTOMY LEVEL 2 (N/A) BILATERAL PELVIC LYMPHADENECTOMY (Bilateral)  Patient Location: PACU  Anesthesia Type: General  Level of Consciousness: awake and alert   Airway and Oxygen Therapy: Patient Spontanous Breathing  Post-op Pain: mild  Post-op Assessment: Post-op Vital signs reviewed, Patient's Cardiovascular Status Stable, Respiratory Function Stable, Patent Airway and No signs of Nausea or vomiting  Last Vitals:  Filed Vitals:   03/16/15 1319  BP: 162/68  Pulse: 52  Temp: 36.3 C  Resp: 16    Post-op Vital Signs: stable   Complications: No apparent anesthesia complications

## 2015-03-16 NOTE — Discharge Instructions (Signed)

## 2015-03-17 ENCOUNTER — Encounter (HOSPITAL_COMMUNITY): Payer: Self-pay | Admitting: Urology

## 2015-03-17 LAB — HEMOGLOBIN AND HEMATOCRIT, BLOOD
HCT: 33 % — ABNORMAL LOW (ref 39.0–52.0)
HEMOGLOBIN: 10.8 g/dL — AB (ref 13.0–17.0)

## 2015-03-17 MED ORDER — BISACODYL 10 MG RE SUPP
10.0000 mg | Freq: Once | RECTAL | Status: AC
  Administered 2015-03-17: 10 mg via RECTAL
  Filled 2015-03-17: qty 1

## 2015-03-17 MED ORDER — HYDROCODONE-ACETAMINOPHEN 5-325 MG PO TABS
1.0000 | ORAL_TABLET | Freq: Four times a day (QID) | ORAL | Status: DC | PRN
  Administered 2015-03-17: 2 via ORAL
  Filled 2015-03-17 (×2): qty 1

## 2015-03-17 NOTE — Discharge Summary (Signed)
  Date of admission: 03/16/2015  Date of discharge: 03/17/2015  Admission diagnosis: Prostate Cancer  Discharge diagnosis: Prostate Cancer  History and Physical: For full details, please see admission history and physical. Briefly, Brian Kaufman is a 64 y.o. gentleman with localized prostate cancer.  After discussing management/treatment options, he elected to proceed with surgical treatment.  Hospital Course: ELIN SEATS was taken to the operating room on 03/16/2015 and underwent a robotic assisted laparoscopic radical prostatectomy. He tolerated this procedure well and without complications. Postoperatively, he was able to be transferred to a regular hospital room following recovery from anesthesia.  He was able to begin ambulating the night of surgery. He remained hemodynamically stable overnight.  He had excellent urine output with appropriately minimal output from his pelvic drain and his pelvic drain was removed on POD #1.  He was transitioned to oral pain medication, tolerated a clear liquid diet, and had met all discharge criteria and was able to be discharged home later on POD#1.  Laboratory values:  Recent Labs  03/16/15 1245 03/17/15 0520  HGB 11.4* 10.8*  HCT 35.8* 33.0*    Disposition: Home  Discharge instruction: He was instructed to be ambulatory but to refrain from heavy lifting, strenuous activity, or driving. He was instructed on urethral catheter care.  Discharge medications:     Medication List    STOP taking these medications        levofloxacin 500 MG tablet  Commonly known as:  LEVAQUIN     VIAGRA 100 MG tablet  Generic drug:  sildenafil      TAKE these medications        ciprofloxacin 500 MG tablet  Commonly known as:  CIPRO  Take 1 tablet (500 mg total) by mouth 2 (two) times daily. Start day prior to office visit for foley removal     HYDROcodone-acetaminophen 5-325 MG per tablet  Commonly known as:  NORCO  Take 1-2 tablets by mouth every 6  (six) hours as needed.     olmesartan 20 MG tablet  Commonly known as:  BENICAR  Take 1 tablet (20 mg total) by mouth daily.     rosuvastatin 10 MG tablet  Commonly known as:  CRESTOR  Take 1 tablet (10 mg total) by mouth daily.        Followup: He will followup in 1 week for catheter removal and to discuss his surgical pathology results.

## 2015-03-17 NOTE — Progress Notes (Signed)
Went over leg bag teaching and foley care with pt and wife.  All questions answered.  Went over discharge papers and prescriptions given.  VSS.  Pt wheeled out by NT.

## 2015-03-17 NOTE — Progress Notes (Signed)
1 Day Post-Op Subjective: The patient is doing well.  No nausea or vomiting. Pain is adequately controlled.  Objective: Vital signs in last 24 hours: Temp:  [97.4 F (36.3 C)-98.6 F (37 C)] 98 F (36.7 C) (03/22 0432) Pulse Rate:  [52-86] 83 (03/22 0432) Resp:  [10-18] 18 (03/22 0432) BP: (129-182)/(61-77) 131/61 mmHg (03/22 0432) SpO2:  [100 %] 100 % (03/22 0432) FiO2 (%):  [2 %] 2 % (03/21 1449)  Intake/Output from previous day: 03/21 0701 - 03/22 0700 In: 5360 [P.O.:60; I.V.:4250; IV Piggyback:1050] Out: 1905 [Urine:1700; Drains:105; Blood:100] Intake/Output this shift:    Physical Exam:  General: Alert and oriented. CV: RRR Lungs: Clear bilaterally. GI: Soft, Nondistended. Incisions: Clean, dry, and intact Urine: Clear JP: serosanguinous Extremities: Nontender, no erythema, no edema.  Lab Results:  Recent Labs  03/16/15 1245 03/17/15 0520  HGB 11.4* 10.8*  HCT 35.8* 33.0*      Assessment/Plan: POD# 1 s/p robotic prostatectomy.  1) SL IVF 2) Ambulate, Incentive spirometry 3) Transition to oral pain medication 4) Dulcolax suppository 5) D/C pelvic drain 6) Plan for likely discharge later today

## 2015-03-17 NOTE — Care Management Note (Signed)
    Page 1 of 1   03/17/2015     2:42:00 PM CARE MANAGEMENT NOTE 03/17/2015  Patient:  Brian Kaufman, Brian Kaufman   Account Number:  [id number]  Date Initiated:  03/17/2015  Documentation initiated by:  Dessa Phi  Subjective/Objective Assessment:   64 y/o m admitted w/Prostate Ca.     Action/Plan:   From home.   Anticipated DC Date:  03/17/2015   Anticipated DC Plan:  [place]  CM consult      Choice offered to / List presented to:             Status of service:  In process, will continue to follow Medicare Important Message given?   (If response is "NO", the following Medicare IM given date fields will be blank) Date Medicare IM given:   Medicare IM given by:   Date Additional Medicare IM given:   Additional Medicare IM given by:    Discharge Disposition:    Per UR Regulation:  Reviewed for med. necessity/level of care/duration of stay  If discussed at [place] of Stay Meetings, dates discussed:    Comments:  03/17/15 Dessa Phi RN BSN NCM [phone] POD#1 lap prostatectomy. d/c home no needs or orders.

## 2015-03-17 NOTE — Progress Notes (Signed)
Assumed care of patient at 23:30.  I agree with previous assessment and will continue to monitor patient. Owens Shark, Jan Walters Cherie

## 2015-03-27 ENCOUNTER — Ambulatory Visit (INDEPENDENT_AMBULATORY_CARE_PROVIDER_SITE_OTHER): Payer: Self-pay | Admitting: Family Medicine

## 2015-03-27 DIAGNOSIS — R69 Illness, unspecified: Secondary | ICD-10-CM

## 2015-03-27 NOTE — Progress Notes (Signed)
  [place] for his NEW PATIENT visit with me today.    HTN: -meds: losartan 20   Hyperlipidemia: -meds: crestor 10 mg   Prostate cancer: -managed by his urologist -underwent robotic prostatectomy on 03/16/15  HM: -colon 2005 -Tdap, shingles vaccines: -HIV screening

## 2015-03-30 ENCOUNTER — Encounter: Payer: Self-pay | Admitting: Gastroenterology

## 2016-07-18 IMAGING — CR DG CHEST 2V
2 series · 2 of 2 positions shown · non-contrast
Comparison: None.

CLINICAL DATA: Preoperative examination prior to robotic assisted
prostatectomy, history of CHF, nonsmoker.

EXAM:
CHEST  2 VIEW

[w chest lat *]
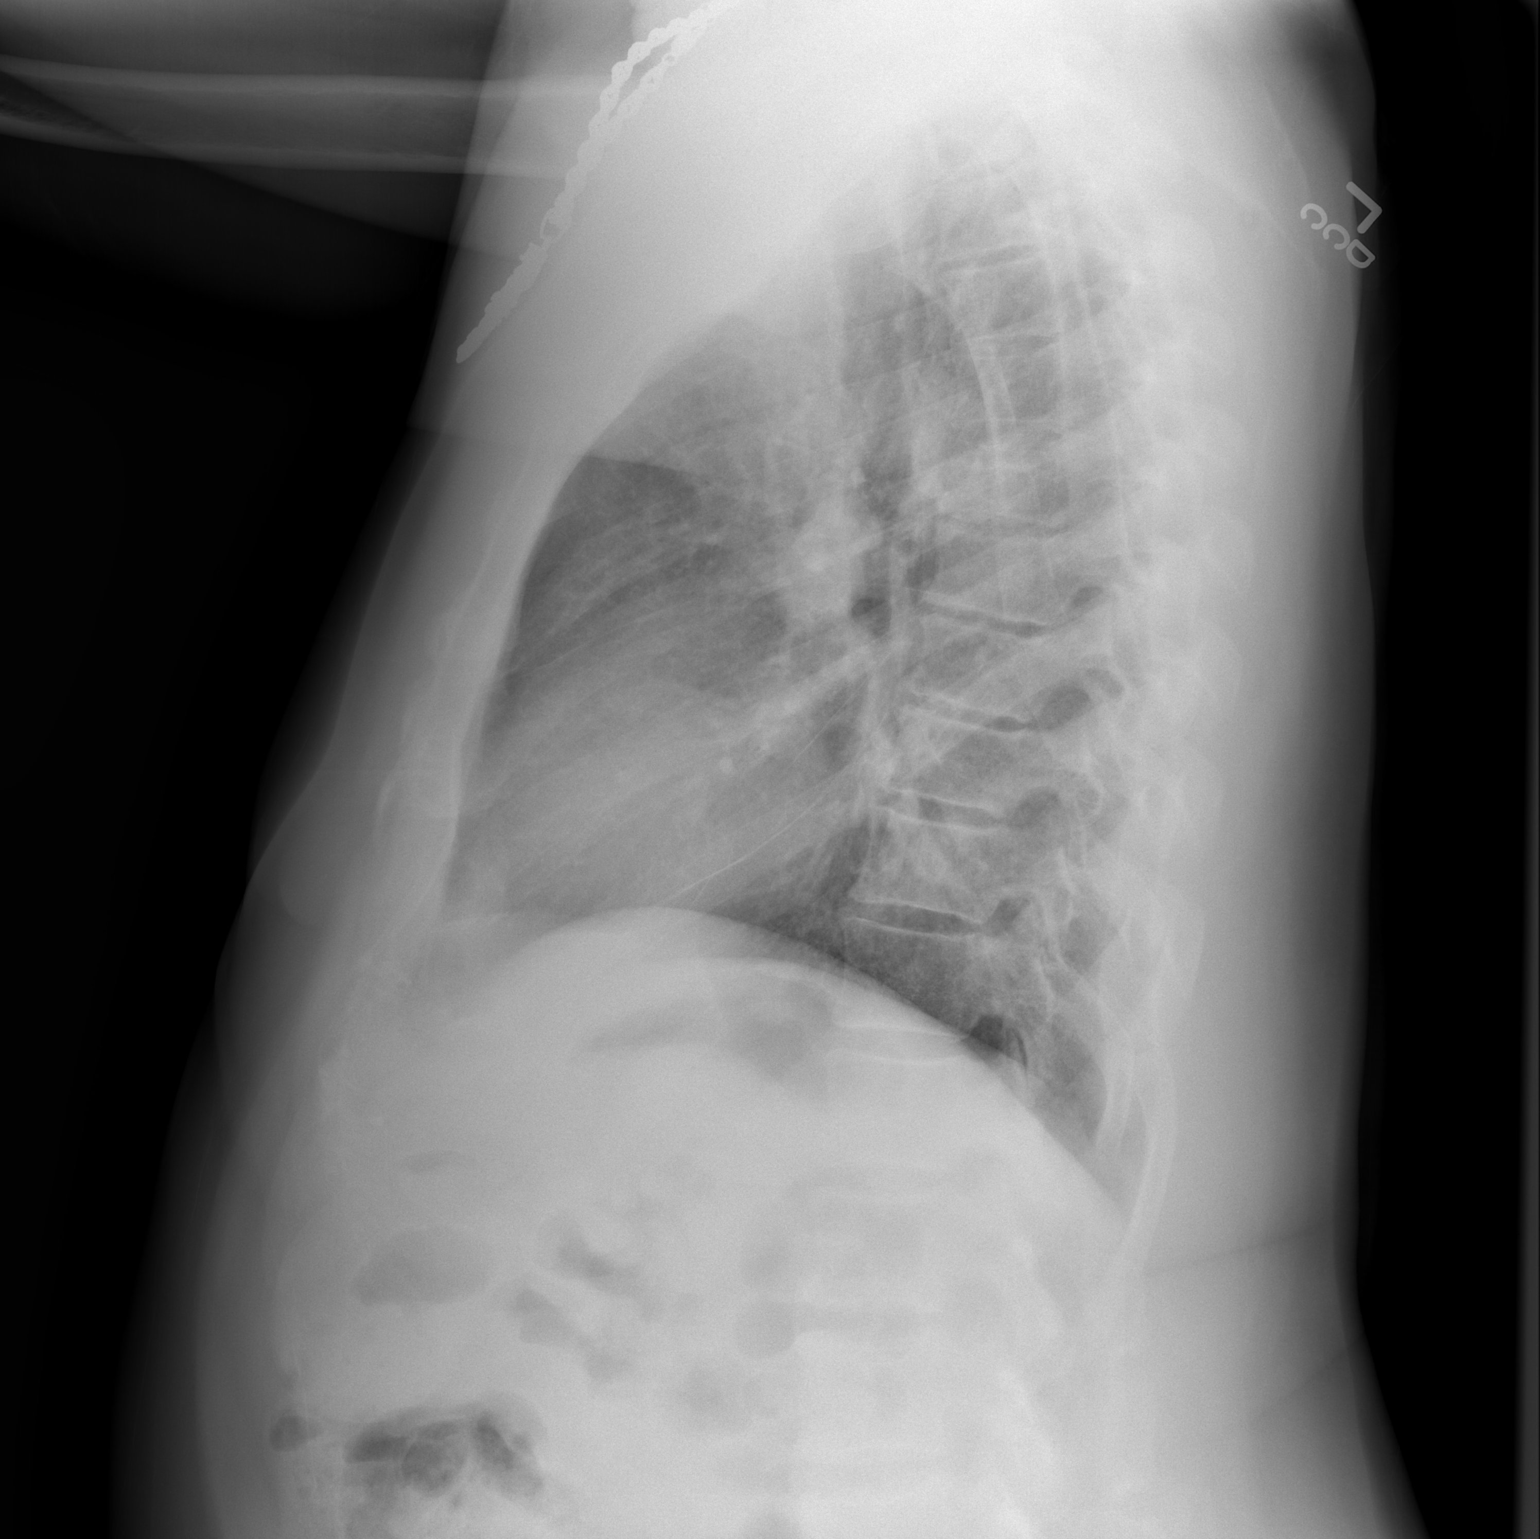

[w chest pa]
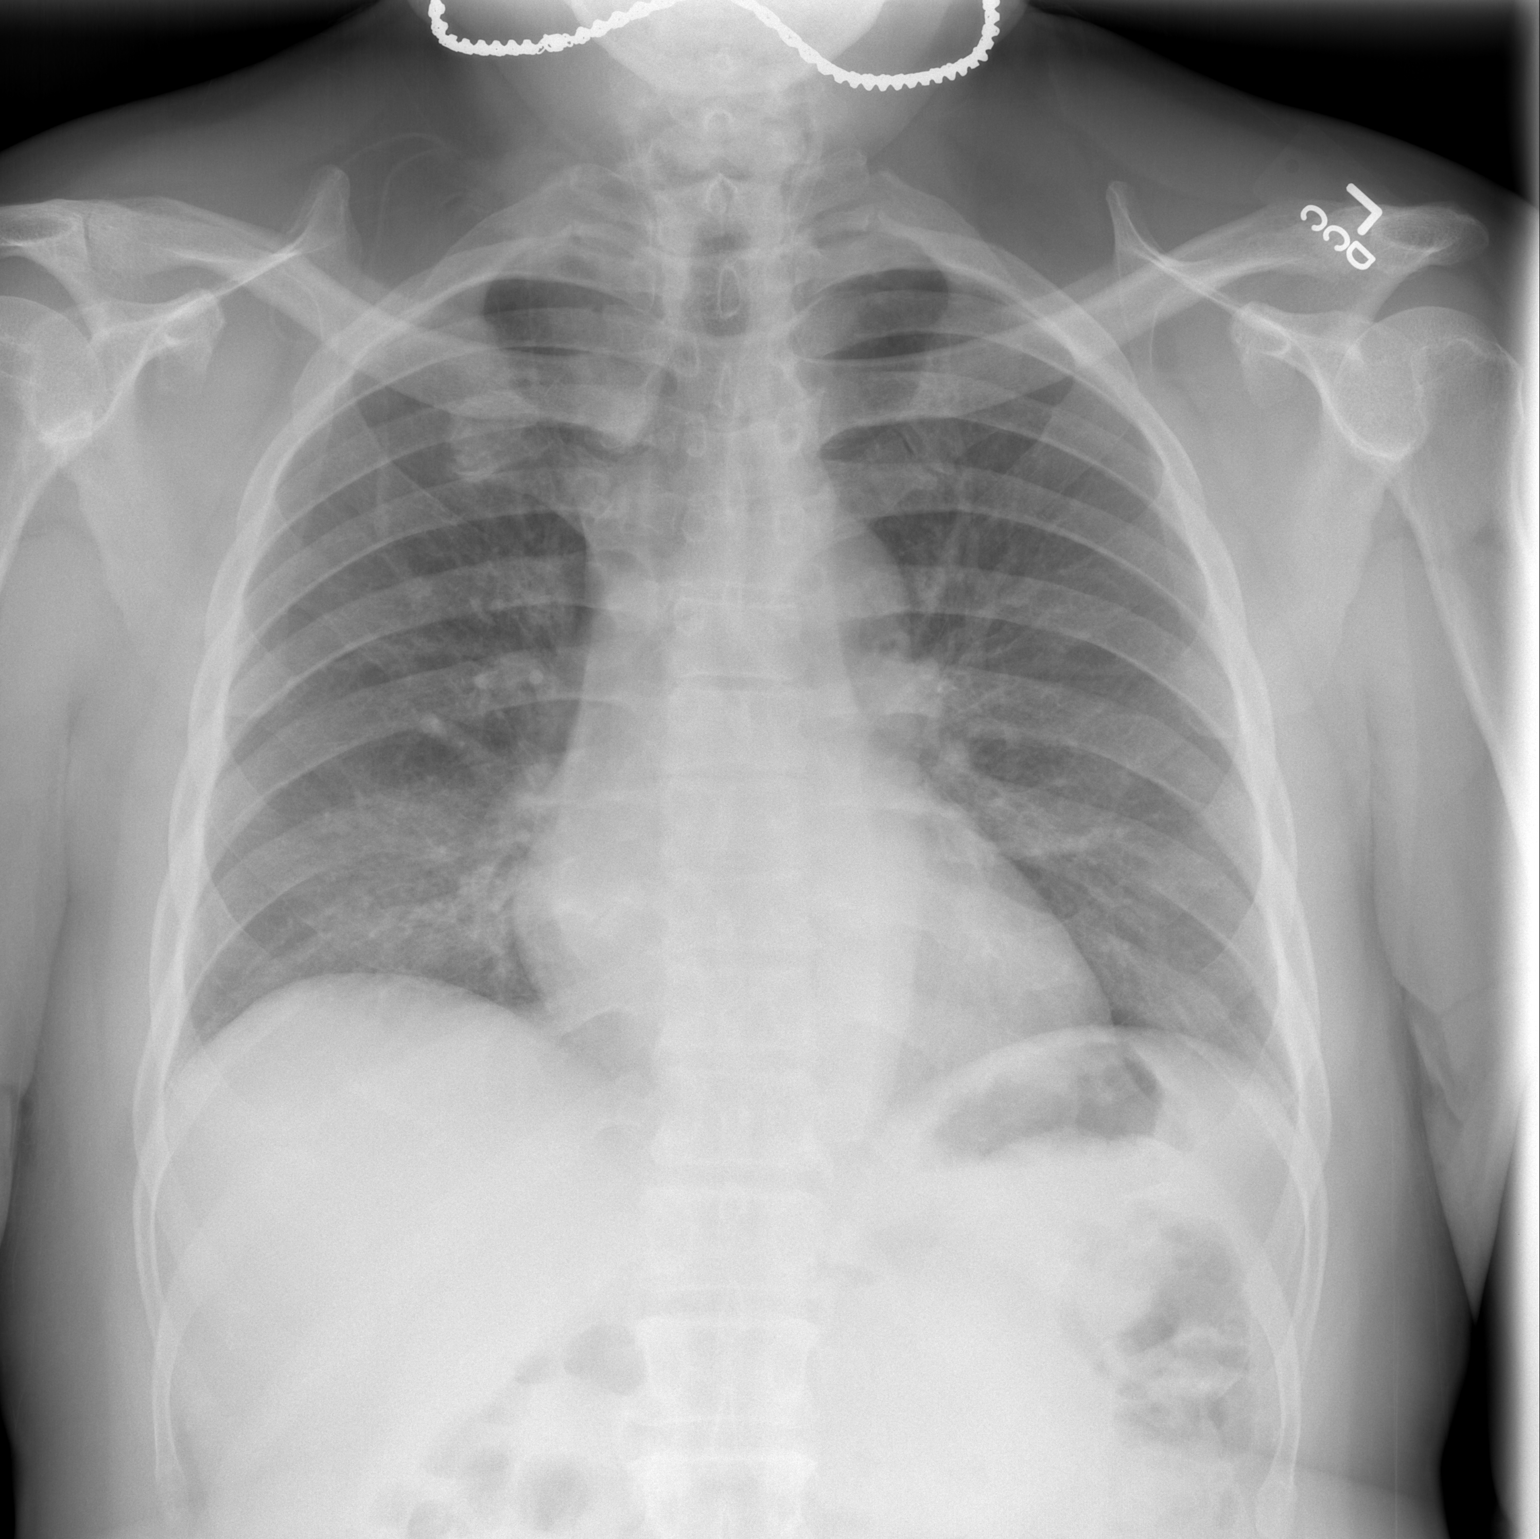

[2 of 2 positions shown; findings below may reference images not displayed]

FINDINGS: The lungs are adequately inflated. There is hazy increased density
in the left infrahilar region not clearly evident on the lateral
film. There is no pleural effusion. The heart and pulmonary
vascularity are normal. The mediastinum is normal in width. The bony
thorax is unremarkable.
IMPRESSION: There is subtle increased density on the right in the perihilar and
infrahilar region that may reflect mild pulmonary interstitial
edema. It is not clearly evident on the lateral film however. Given
that there is a history of CHF and the patient has planned surgery,
a repeat PA chest film with deep inspiration is recommended.

## 2016-07-20 IMAGING — CR DG CHEST 2V
2 series · 2 of 2 positions shown · non-contrast
Comparison: 03/10/2015

CLINICAL DATA: Prostate cancer preop evaluation

EXAM:
CHEST  2 VIEW

[w chest pa]
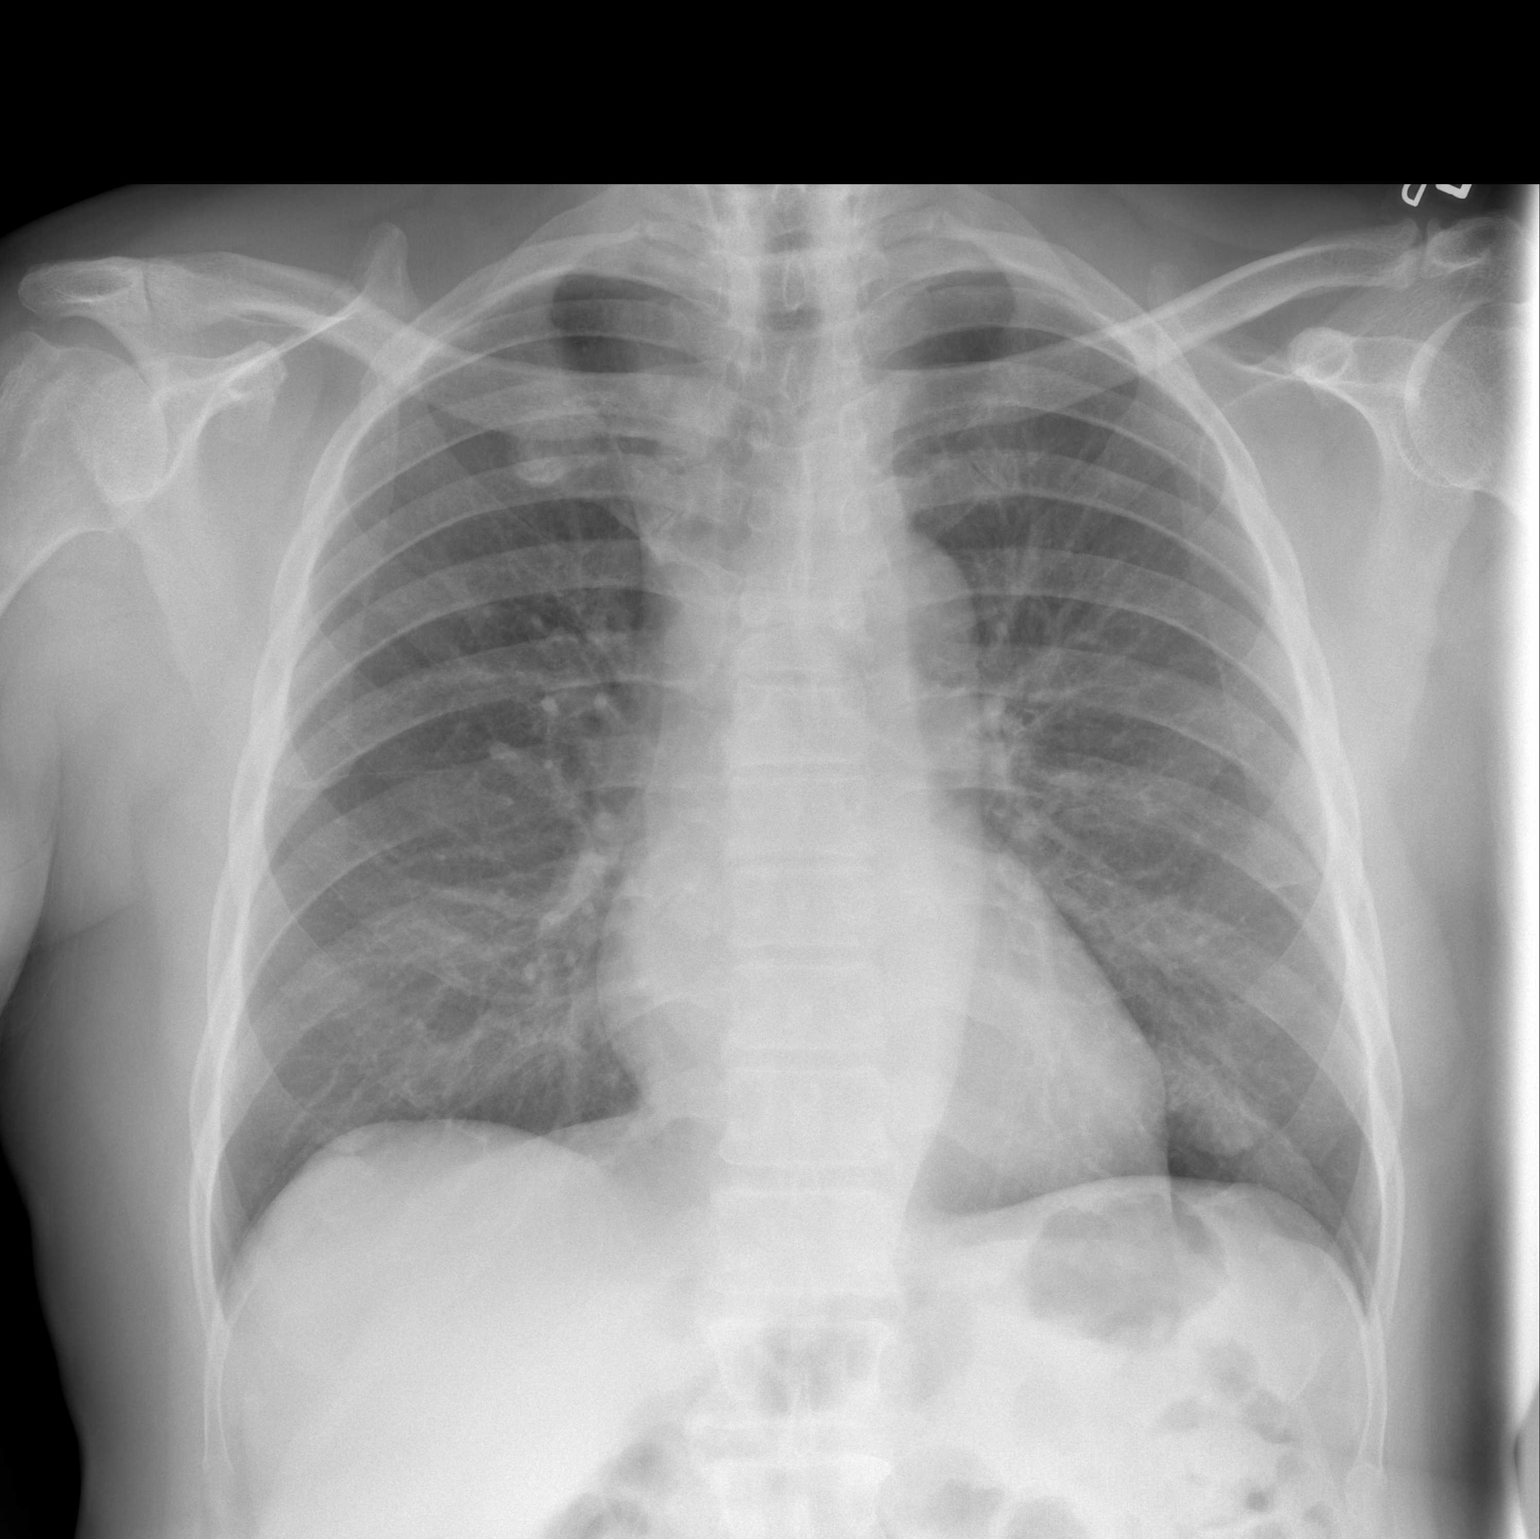

[w chest lat]
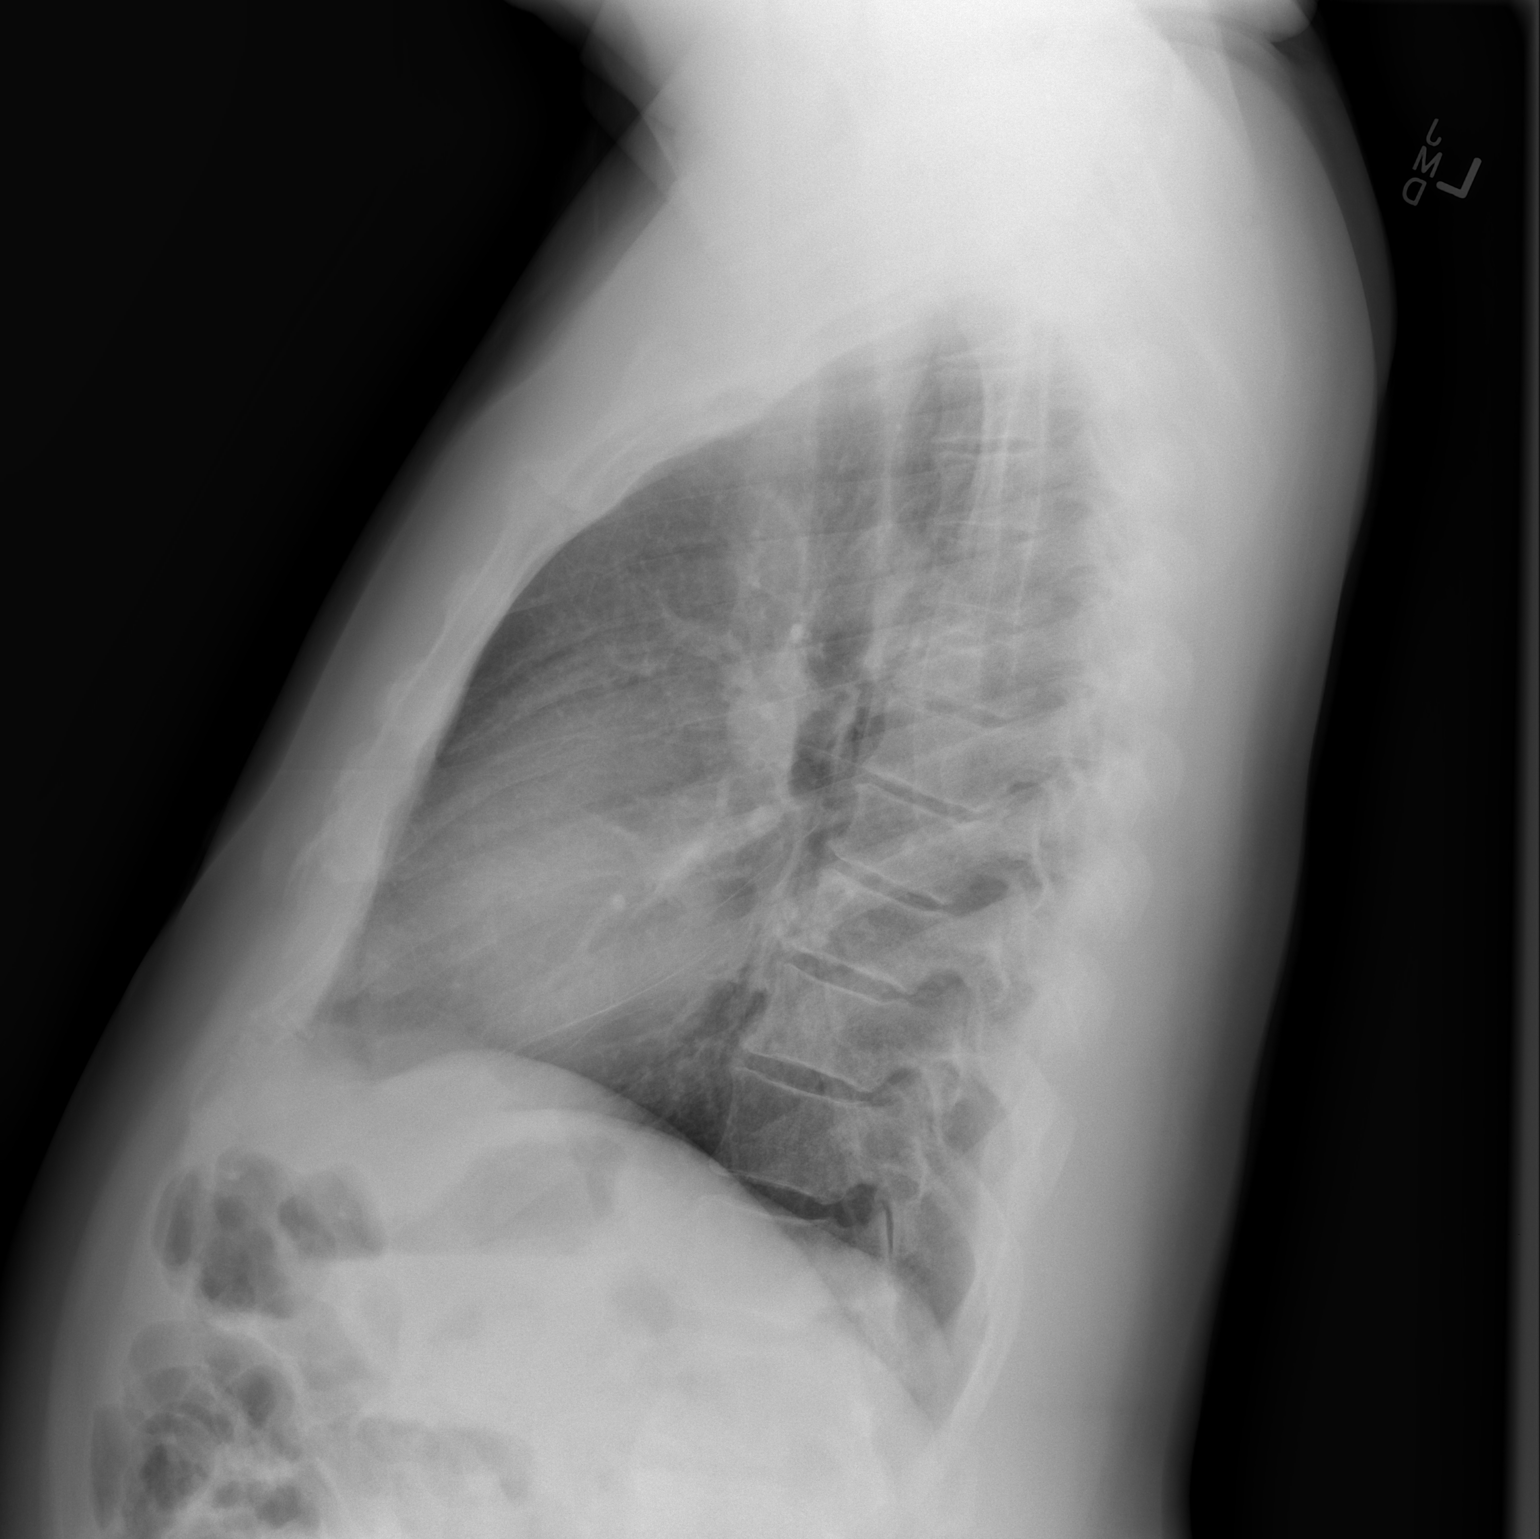

[2 of 2 positions shown; findings below may reference images not displayed]

FINDINGS: Heart size and vascularity normal. Patchy density right lung base
has resolved and may have been atelectasis or infiltrate. Currently
the lungs are clear without infiltrate effusion or mass. No lung
nodule.
IMPRESSION: No active cardiopulmonary disease.
